# Patient Record
Sex: Male | Born: 1970 | Race: White | Hispanic: No | Marital: Single | Smoking: Never smoker
Health system: Southern US, Community
[De-identification: ages and names within clinical notes are randomized; demographics above are authoritative.]

## PROBLEM LIST (undated history)

## (undated) DIAGNOSIS — I2510 Atherosclerotic heart disease of native coronary artery without angina pectoris: Secondary | ICD-10-CM

## (undated) DIAGNOSIS — E785 Hyperlipidemia, unspecified: Secondary | ICD-10-CM

## (undated) DIAGNOSIS — T7840XA Allergy, unspecified, initial encounter: Secondary | ICD-10-CM

## (undated) HISTORY — PX: WISDOM TOOTH EXTRACTION: SHX21

## (undated) HISTORY — DX: Allergy, unspecified, initial encounter: T78.40XA

## (undated) HISTORY — DX: Atherosclerotic heart disease of native coronary artery without angina pectoris: I25.10

---

## 2016-01-30 DIAGNOSIS — I219 Acute myocardial infarction, unspecified: Secondary | ICD-10-CM

## 2016-01-30 HISTORY — DX: Acute myocardial infarction, unspecified: I21.9

## 2016-02-05 ENCOUNTER — Encounter (HOSPITAL_COMMUNITY)
Admission: EM | Disposition: A | Discharge status: Home / Self Care | Payer: Self-pay | Source: Home / Self Care | Attending: Interventional Cardiology

## 2016-02-05 ENCOUNTER — Encounter (HOSPITAL_COMMUNITY): Payer: Self-pay | Admitting: Emergency Medicine

## 2016-02-05 ENCOUNTER — Inpatient Hospital Stay (HOSPITAL_COMMUNITY)
Admission: EM | Admit: 2016-02-05 | Discharge: 2016-02-07 | DRG: 247 | Disposition: A | Discharge status: Home / Self Care | Payer: BC Managed Care – PPO | Attending: Interventional Cardiology | Admitting: Interventional Cardiology

## 2016-02-05 DIAGNOSIS — I2510 Atherosclerotic heart disease of native coronary artery without angina pectoris: Secondary | ICD-10-CM

## 2016-02-05 DIAGNOSIS — I2119 ST elevation (STEMI) myocardial infarction involving other coronary artery of inferior wall: Principal | ICD-10-CM | POA: Diagnosis present

## 2016-02-05 DIAGNOSIS — Z955 Presence of coronary angioplasty implant and graft: Secondary | ICD-10-CM

## 2016-02-05 DIAGNOSIS — Z8249 Family history of ischemic heart disease and other diseases of the circulatory system: Secondary | ICD-10-CM

## 2016-02-05 DIAGNOSIS — I2111 ST elevation (STEMI) myocardial infarction involving right coronary artery: Secondary | ICD-10-CM

## 2016-02-05 DIAGNOSIS — I252 Old myocardial infarction: Secondary | ICD-10-CM | POA: Diagnosis present

## 2016-02-05 DIAGNOSIS — E785 Hyperlipidemia, unspecified: Secondary | ICD-10-CM | POA: Diagnosis not present

## 2016-02-05 HISTORY — PX: CARDIAC CATHETERIZATION: SHX172

## 2016-02-05 HISTORY — DX: Hyperlipidemia, unspecified: E78.5

## 2016-02-05 LAB — POCT ACTIVATED CLOTTING TIME
ACTIVATED CLOTTING TIME: 569 s
ACTIVATED CLOTTING TIME: 276 s

## 2016-02-05 LAB — MAGNESIUM: MAGNESIUM: 1.9 mg/dL (ref 1.7–2.4)

## 2016-02-05 LAB — LIPID PANEL
Cholesterol: 171 mg/dL (ref 0–200)
HDL: 27 mg/dL — AB (ref >40)
LDL CALC: 123 mg/dL — AB (ref 0–99)
Total CHOL/HDL Ratio: 6.3 RATIO
Triglycerides: 105 mg/dL (ref <150)
VLDL: 21 mg/dL (ref 0–40)

## 2016-02-05 LAB — CBC
HCT: 47.4 % (ref 39.0–52.0)
HCT: 43 % (ref 39.0–52.0)
Hemoglobin: 14.2 g/dL (ref 13.0–17.0)
Hemoglobin: 14.3 g/dL (ref 13.0–17.0)
MCH: 26.7 pg (ref 26.0–34.0)
MCH: 29.1 pg (ref 26.0–34.0)
MCHC: 30 g/dL (ref 30.0–36.0)
MCHC: 33.3 g/dL (ref 30.0–36.0)
MCV: 89.3 fL (ref 78.0–100.0)
MCV: 87.4 fL (ref 78.0–100.0)
PLATELETS: 174 10*3/uL (ref 150–400)
Platelets: 156 10*3/uL (ref 150–400)
RBC: 5.31 MIL/uL (ref 4.22–5.81)
RBC: 4.92 MIL/uL (ref 4.22–5.81)
RDW: 12.5 % (ref 11.5–15.5)
RDW: 12.2 % (ref 11.5–15.5)
WBC: 7.3 10*3/uL (ref 4.0–10.5)
WBC: 9.5 10*3/uL (ref 4.0–10.5)

## 2016-02-05 LAB — COMPREHENSIVE METABOLIC PANEL
ALT: 37 U/L (ref 17–63)
AST: 29 U/L (ref 15–41)
Albumin: 4.5 g/dL (ref 3.5–5.0)
Alkaline Phosphatase: 63 U/L (ref 38–126)
Anion gap: 11 (ref 5–15)
BUN: 14 mg/dL (ref 6–20)
CO2: 23 mmol/L (ref 22–32)
Calcium: 9 mg/dL (ref 8.9–10.3)
Chloride: 100 mmol/L — ABNORMAL LOW (ref 101–111)
Creatinine, Ser: 0.92 mg/dL (ref 0.61–1.24)
GFR calc Af Amer: >60 mL/min (ref >60)
GFR calc non Af Amer: >60 mL/min (ref >60)
Glucose, Bld: 118 mg/dL — ABNORMAL HIGH (ref 65–99)
Potassium: 4.8 mmol/L (ref 3.5–5.1)
Sodium: 134 mmol/L — ABNORMAL LOW (ref 135–145)
Total Bilirubin: 1.2 mg/dL (ref 0.3–1.2)
Total Protein: 7.8 g/dL (ref 6.5–8.1)

## 2016-02-05 LAB — MRSA PCR SCREENING: MRSA BY PCR: NEGATIVE

## 2016-02-05 LAB — APTT: aPTT: 28 seconds (ref 24–37)

## 2016-02-05 LAB — PROTIME-INR
INR: 1 (ref 0.00–1.49)
Prothrombin Time: 13.4 seconds (ref 11.6–15.2)

## 2016-02-05 LAB — CREATININE, SERUM
Creatinine, Ser: 0.84 mg/dL (ref 0.61–1.24)
GFR calc Af Amer: >60 mL/min (ref >60)
GFR calc non Af Amer: >60 mL/min (ref >60)

## 2016-02-05 LAB — TSH: TSH: 1.253 u[IU]/mL (ref 0.350–4.500)

## 2016-02-05 LAB — TROPONIN I
TROPONIN I: 1.26 ng/mL — AB (ref <0.031)
Troponin I: <0.03 ng/mL (ref <0.031)
Troponin I: 8.72 ng/mL (ref <0.031)

## 2016-02-05 SURGERY — LEFT HEART CATH AND CORONARY ANGIOGRAPHY

## 2016-02-05 MED ORDER — SODIUM CHLORIDE 0.9 % IV SOLN
INTRAVENOUS | Status: AC
  Administered 2016-02-05: 22:00:00 via INTRAVENOUS
  Administered 2016-02-05: 100 mL/h via INTRAVENOUS

## 2016-02-05 MED ORDER — OXYCODONE-ACETAMINOPHEN 5-325 MG PO TABS: 1.0000 | ORAL_TABLET | ORAL | Status: DC | PRN

## 2016-02-05 MED ORDER — HEPARIN SODIUM (PORCINE) 1000 UNIT/ML IJ SOLN
INTRAMUSCULAR | Status: AC
  Filled 2016-02-05: qty 1

## 2016-02-05 MED ORDER — ONDANSETRON HCL 4 MG/2ML IJ SOLN: 4.0000 mg | Freq: Four times a day (QID) | INTRAMUSCULAR | Status: DC | PRN

## 2016-02-05 MED ORDER — METOPROLOL TARTRATE 1 MG/ML IV SOLN
5.0000 mg | Freq: Once | INTRAVENOUS | Status: AC
  Administered 2016-02-05: 5 mg via INTRAVENOUS
  Filled 2016-02-05: qty 5

## 2016-02-05 MED ORDER — TICAGRELOR 90 MG PO TABS
ORAL_TABLET | ORAL | Status: AC
  Filled 2016-02-05: qty 2

## 2016-02-05 MED ORDER — MIDAZOLAM HCL 2 MG/2ML IJ SOLN
INTRAMUSCULAR | Status: DC | PRN
  Administered 2016-02-05: 1 mg via INTRAVENOUS

## 2016-02-05 MED ORDER — SODIUM CHLORIDE 0.9 % IV SOLN: 250.0000 mL | INTRAVENOUS | Status: DC | PRN

## 2016-02-05 MED ORDER — HEPARIN (PORCINE) IN NACL 100-0.45 UNIT/ML-% IJ SOLN
INTRAMUSCULAR | Status: AC
  Administered 2016-02-05: 1000 [IU]/h via INTRAVENOUS
  Filled 2016-02-05: qty 250

## 2016-02-05 MED ORDER — HEPARIN (PORCINE) IN NACL 2-0.9 UNIT/ML-% IJ SOLN
INTRAMUSCULAR | Status: AC
  Filled 2016-02-05: qty 500

## 2016-02-05 MED ORDER — SODIUM CHLORIDE 0.9% FLUSH
3.0000 mL | Freq: Two times a day (BID) | INTRAVENOUS | Status: DC
  Administered 2016-02-05 – 2016-02-07 (×4): 3 mL via INTRAVENOUS

## 2016-02-05 MED ORDER — TICAGRELOR 90 MG PO TABS
ORAL_TABLET | ORAL | Status: DC | PRN
  Administered 2016-02-05: 180 mg via ORAL

## 2016-02-05 MED ORDER — ENOXAPARIN SODIUM 40 MG/0.4ML ~~LOC~~ SOLN: 40.0000 mg | SUBCUTANEOUS | Status: DC

## 2016-02-05 MED ORDER — ATROPINE SULFATE 0.1 MG/ML IJ SOLN
INTRAMUSCULAR | Status: AC
  Filled 2016-02-05: qty 10

## 2016-02-05 MED ORDER — NITROGLYCERIN 1 MG/10 ML FOR IR/CATH LAB
INTRA_ARTERIAL | Status: DC | PRN
  Administered 2016-02-05: 200 ug via INTRACORONARY
  Administered 2016-02-05: 100 ug via INTRACORONARY

## 2016-02-05 MED ORDER — ATROPINE SULFATE 0.1 MG/ML IJ SOLN
INTRAMUSCULAR | Status: DC | PRN
  Administered 2016-02-05: 1 mg via INTRAVENOUS

## 2016-02-05 MED ORDER — SODIUM CHLORIDE 0.9% FLUSH: 3.0000 mL | INTRAVENOUS | Status: DC | PRN

## 2016-02-05 MED ORDER — VERAPAMIL HCL 2.5 MG/ML IV SOLN
INTRAVENOUS | Status: AC
  Filled 2016-02-05: qty 2

## 2016-02-05 MED ORDER — TICAGRELOR 90 MG PO TABS
90.0000 mg | ORAL_TABLET | Freq: Two times a day (BID) | ORAL | Status: DC
  Administered 2016-02-05 – 2016-02-07 (×4): 90 mg via ORAL
  Filled 2016-02-05 (×4): qty 1

## 2016-02-05 MED ORDER — ASPIRIN 81 MG PO CHEW
81.0000 mg | CHEWABLE_TABLET | Freq: Every day | ORAL | Status: DC
  Administered 2016-02-06 – 2016-02-07 (×2): 81 mg via ORAL
  Filled 2016-02-05 (×2): qty 1

## 2016-02-05 MED ORDER — FENTANYL CITRATE (PF) 100 MCG/2ML IJ SOLN
INTRAMUSCULAR | Status: DC | PRN
  Administered 2016-02-05: 50 ug via INTRAVENOUS

## 2016-02-05 MED ORDER — SODIUM CHLORIDE 0.9% FLUSH
3.0000 mL | Freq: Two times a day (BID) | INTRAVENOUS | Status: DC
  Administered 2016-02-05 – 2016-02-06 (×3): 3 mL via INTRAVENOUS

## 2016-02-05 MED ORDER — LIDOCAINE HCL (PF) 1 % IJ SOLN
INTRAMUSCULAR | Status: DC | PRN
  Administered 2016-02-05: 3 mL

## 2016-02-05 MED ORDER — ALPRAZOLAM 0.25 MG PO TABS: 0.2500 mg | ORAL_TABLET | Freq: Two times a day (BID) | ORAL | Status: DC | PRN

## 2016-02-05 MED ORDER — METOPROLOL TARTRATE 25 MG PO TABS
25.0000 mg | ORAL_TABLET | Freq: Two times a day (BID) | ORAL | Status: DC
  Administered 2016-02-06 – 2016-02-07 (×3): 25 mg via ORAL
  Filled 2016-02-05 (×3): qty 1

## 2016-02-05 MED ORDER — HEPARIN (PORCINE) IN NACL 100-0.45 UNIT/ML-% IJ SOLN
1000.0000 [IU]/h | INTRAMUSCULAR | Status: DC
Start: 2016-02-05 — End: 2016-02-05
  Administered 2016-02-05: 1000 [IU]/h via INTRAVENOUS

## 2016-02-05 MED ORDER — LIDOCAINE HCL (PF) 1 % IJ SOLN
INTRAMUSCULAR | Status: AC
  Filled 2016-02-05: qty 30

## 2016-02-05 MED ORDER — NITROGLYCERIN 0.4 MG SL SUBL: 0.4000 mg | SUBLINGUAL_TABLET | SUBLINGUAL | Status: DC | PRN

## 2016-02-05 MED ORDER — ASPIRIN 81 MG PO CHEW
CHEWABLE_TABLET | ORAL | Status: AC
  Filled 2016-02-05: qty 4

## 2016-02-05 MED ORDER — ATORVASTATIN CALCIUM 80 MG PO TABS
80.0000 mg | ORAL_TABLET | Freq: Every day | ORAL | Status: DC
  Administered 2016-02-05 – 2016-02-06 (×2): 80 mg via ORAL
  Filled 2016-02-05 (×2): qty 1

## 2016-02-05 MED ORDER — MIDAZOLAM HCL 2 MG/2ML IJ SOLN
INTRAMUSCULAR | Status: AC
  Filled 2016-02-05: qty 2

## 2016-02-05 MED ORDER — SODIUM CHLORIDE 0.9 % IV SOLN
INTRAVENOUS | Status: DC | PRN
  Administered 2016-02-05: 285 mL/h via INTRAVENOUS
  Administered 2016-02-05: 999 mL/h via INTRAVENOUS

## 2016-02-05 MED ORDER — ZOLPIDEM TARTRATE 5 MG PO TABS: 5.0000 mg | ORAL_TABLET | Freq: Every evening | ORAL | Status: DC | PRN

## 2016-02-05 MED ORDER — ACETAMINOPHEN 325 MG PO TABS
650.0000 mg | ORAL_TABLET | ORAL | Status: DC | PRN
  Administered 2016-02-05 – 2016-02-06 (×2): 650 mg via ORAL
  Filled 2016-02-05 (×2): qty 2

## 2016-02-05 MED ORDER — ACETAMINOPHEN 325 MG PO TABS: 650.0000 mg | ORAL_TABLET | ORAL | Status: DC | PRN

## 2016-02-05 MED ORDER — SODIUM CHLORIDE 0.9 % IV SOLN
INTRAVENOUS | Status: DC
  Administered 2016-02-05: 11:00:00 via INTRAVENOUS

## 2016-02-05 MED ORDER — HEPARIN SODIUM (PORCINE) 5000 UNIT/ML IJ SOLN
4000.0000 [IU] | INTRAMUSCULAR | Status: AC
  Administered 2016-02-05: 4000 [IU] via INTRAVENOUS

## 2016-02-05 MED ORDER — FENTANYL CITRATE (PF) 100 MCG/2ML IJ SOLN
INTRAMUSCULAR | Status: AC
  Filled 2016-02-05: qty 2

## 2016-02-05 MED ORDER — ASPIRIN 81 MG PO CHEW
324.0000 mg | CHEWABLE_TABLET | Freq: Once | ORAL | Status: AC
  Administered 2016-02-05: 324 mg via ORAL

## 2016-02-05 MED ORDER — BIVALIRUDIN BOLUS VIA INFUSION - CUPID
INTRAVENOUS | Status: DC | PRN
  Administered 2016-02-05: 71.475 mg via INTRAVENOUS

## 2016-02-05 MED ORDER — HEPARIN (PORCINE) IN NACL 2-0.9 UNIT/ML-% IJ SOLN
INTRAMUSCULAR | Status: AC
  Filled 2016-02-05: qty 1000

## 2016-02-05 MED ORDER — IOHEXOL 350 MG/ML SOLN
INTRAVENOUS | Status: DC | PRN
  Administered 2016-02-05: 200 mL via INTRA_ARTERIAL

## 2016-02-05 MED ORDER — HEPARIN (PORCINE) IN NACL 2-0.9 UNIT/ML-% IJ SOLN
INTRAMUSCULAR | Status: DC | PRN
  Administered 2016-02-05: 1500 mL

## 2016-02-05 MED ORDER — ENOXAPARIN SODIUM 40 MG/0.4ML ~~LOC~~ SOLN
40.0000 mg | SUBCUTANEOUS | Status: DC
  Administered 2016-02-06: 40 mg via SUBCUTANEOUS
  Filled 2016-02-05: qty 0.4

## 2016-02-05 MED ORDER — BIVALIRUDIN 250 MG IV SOLR
INTRAVENOUS | Status: AC
  Filled 2016-02-05: qty 250

## 2016-02-05 MED ORDER — SODIUM CHLORIDE 0.9 % IV SOLN
250.0000 mg | INTRAVENOUS | Status: DC | PRN
  Administered 2016-02-05: 1.75 mg/kg/h via INTRAVENOUS

## 2016-02-05 SURGICAL SUPPLY — 17 items
BALLN EUPHORA RX 3.0X12 (BALLOONS) ×3
BALLN ~~LOC~~ EMERGE MR 4.5X12 (BALLOONS) ×3
BALLOON EUPHORA RX 3.0X12 (BALLOONS) ×1 IMPLANT
BALLOON ~~LOC~~ EMERGE MR 4.5X12 (BALLOONS) ×1 IMPLANT
CATH INFINITI 5 FR JL3.5 (CATHETERS) ×3 IMPLANT
CATH INFINITI 5FR JL4 (CATHETERS) ×3 IMPLANT
CATH VISTA GUIDE 6FR JR4 (CATHETERS) ×3 IMPLANT
DEVICE RAD COMP TR BAND LRG (VASCULAR PRODUCTS) ×3 IMPLANT
GLIDESHEATH SLEND A-KIT 6F 22G (SHEATH) ×3 IMPLANT
KIT ENCORE 26 ADVANTAGE (KITS) ×3 IMPLANT
KIT HEART LEFT (KITS) ×3 IMPLANT
PACK CARDIAC CATHETERIZATION (CUSTOM PROCEDURE TRAY) ×3 IMPLANT
STENT PROMUS PREM MR 4.0X20 (Permanent Stent) ×3 IMPLANT
TRANSDUCER W/STOPCOCK (MISCELLANEOUS) ×3 IMPLANT
TUBING CIL FLEX 10 FLL-RA (TUBING) ×3 IMPLANT
WIRE RUNTHROUGH .014X180CM (WIRE) ×3 IMPLANT
WIRE SAFE-T 1.5MM-J .035X260CM (WIRE) ×3 IMPLANT

## 2016-02-05 NOTE — ED Provider Notes (Signed)
CSN: [id number]     Arrival date & time 02/05/16  1054 History   Chief Complaint  Patient presents with  . Code STEMI   The history is provided by the patient. No language interpreter was used.     HPI Comments:  Paul Sullivan is a 45 y.o. male who presents to the Emergency Department complaining of CP. He was immediately evaluated and after being given his EKG. He reports chest pain which began around 9 AM this morning while moving. He describes a tightness/pressure in the center of his chest which progressively intensified. It did not radiate. Currently improved to the point of almost complete resolution. He denies any other associated symptoms such as dyspnea, nausea, diaphoresis or palpitations. Denies any past history of known coronary artery disease. He has been previously told that he has borderline high blood pressure and cholesterol, but is not on medications for it. He does not have a cardiologist.   History reviewed. No pertinent past medical history. History reviewed. No pertinent past surgical history. History reviewed. No pertinent family history. Social History  Substance Use Topics  . Smoking status: Never Smoker   . Smokeless tobacco: None  . Alcohol Use: No    Review of Systems  All other systems reviewed and are negative.   Allergies  Review of patient's allergies indicates no known allergies.  Home Medications   Prior to Admission medications   Not on File   BP 136/94 mmHg  Pulse 82  Temp(Src) 98.3 F (36.8 C) (Oral)  Resp 17  Ht 6\' 2"  (1.88 m)  Wt 210 lb (95.255 kg)  BMI 26.95 kg/m2  SpO2 97% Physical Exam  Constitutional: He appears well-developed and well-nourished. No distress.  HENT:  Head: Normocephalic and atraumatic.  Eyes: Conjunctivae are normal. Right eye exhibits no discharge. Left eye exhibits no discharge.  Neck: Neck supple.  Cardiovascular: Normal rate, regular rhythm and normal heart sounds.  Exam reveals no gallop and no  friction rub.   No murmur heard. Pulmonary/Chest: Effort normal and breath sounds normal. No respiratory distress.  Abdominal: Soft. He exhibits no distension. There is no tenderness.  Musculoskeletal: He exhibits no edema or tenderness.  Neurological: He is alert.  Skin: Skin is warm and dry.  Psychiatric: He has a normal mood and affect. His behavior is normal. Thought content normal.  Nursing note and vitals reviewed.   ED Course  Procedures   CRITICAL CARE Performed by: Virgel Manifold Total critical care time: 35 minutes Critical care time was exclusive of separately billable procedures and treating other patients. Critical care was necessary to treat or prevent imminent or life-threatening deterioration. Critical care was time spent personally by me on the following activities: development of treatment plan with patient and/or surrogate as well as nursing, discussions with consultants, evaluation of patient's response to treatment, examination of patient, obtaining history from patient or surrogate, ordering and performing treatments and interventions, ordering and review of laboratory studies, ordering and review of radiographic studies, pulse oximetry and re-evaluation of patient's condition.   DIAGNOSTIC STUDIES:  Oxygen Saturation is 96% on RA, normal by my interpretation.    COORDINATION OF CARE:  11:20 AM Discussed treatment plan with pt at bedside and pt agreed to plan.  Labs Review Labs Reviewed  CBC  APTT  COMPREHENSIVE METABOLIC PANEL  PROTIME-INR  TROPONIN I    Imaging Review No results found. I have personally reviewed and evaluated these images and lab results as part of my medical decision-making.  EKG Interpretation   Date/Time:  Tuesday February 05 2016 11:00:43 EST Ventricular Rate:  81 PR Interval:  150 QRS Duration: 93 QT Interval:  354 QTC Calculation: 411 R Axis:   85 Text Interpretation:  Sinus rhythm with sinus arrhythmia Inferior  infarct,  acute (RCA) No old tracing to compare Confirmed by [place]  MD, Laniece Hornbaker  ([id number]) on 02/05/2016 11:25:52 AM      MDM   Final diagnoses:  ST elevation myocardial infarction involving right coronary artery ([place])    45 year old male with chest pain. Inferior ST segment elevation with reciprocal changes. He was made a "code STEMI." Aspirin. Heparin. RV involvement considered. BP 130/90. HR 70s. Low dose metoprolol ordered. Discussed with Dr Tamala Julian, cardiology. Preferred beta blocker not be given but unfortunately it was before I could stop the nurse. Transfer to [facility].    Virgel Manifold, MD 02/05/16 1130

## 2016-02-05 NOTE — H&P (Signed)
History and Physical   Patient ID: Paul Sullivan MRN: [id number], DOB/AGE: [birth year] 45 y.o. Date of Encounter: 02/05/2016  Primary Physician: Chesley Noon, MD Primary Cardiologist: New, Dr Tamala Julian, may want f/u in [place]  Chief Complaint:  Inferior STEMI  HPI: Paul Sullivan is a 45 y.o. male with a family history of CAD, but no personal history of coronary artery disease, hypertension, hyperlipidemia or diabetes.  He was in his usual state of health recently. 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  Today, he was at his office about 8:00. He was having some chest discomfort, but did nothing about it. At approximately 9 AM, the symptoms got worse. He had been exerting himself prior to this, but was standing still when the pain got worse. The pain was a tightness and pressure. It radiated up into his left arm. He felt weak and may have been a little diaphoretic, but denies nausea, vomiting or shortness of breath. There is a 2 month history of exertional chest tightness prior to today. He did not understand the significance of the discomfort.  The pain reached to 10/10. He decided to get help for did drive to the [facility] emergency room. In the emergency room, his ECG was consistent with an inferior STEMI. He was treated with aspirin 324 mg, heparin 4000 units into drip plus metoprolol 5 mg IV. EMS was called and he was transported emergently to [facility] and taken directly to cath lab.  Upon arrival to the cath lab, his discomfort was improved, but was still present. His ECG was still abnormal.  History reviewed. No pertinent past medical history.  Surgical History: History reviewed. No pertinent past surgical history.   I have reviewed the patient's current medications. Prior to Admission medications   None   Scheduled Meds: . aspirin       Continuous Infusions: . sodium  chloride 20 mL/hr at 02/05/16 1117  . sodium chloride 999 mL/hr (02/05/16 1224)  . heparin 1,000 Units/hr (02/05/16 1114)   PRN Meds:.  Allergies: No Known Allergies  Social History   Social History  . Marital Status: Married    Spouse Name: N/A  . Number of Children: N/A  . Years of Education: N/A   Occupational History  . Blanket    Social History Main Topics  . Smoking status: Never Smoker   . Smokeless tobacco: Not on file  . Alcohol Use: No  . Drug Use: No  . Sexual Activity: Not on file   Other Topics Concern  . Not on file   Social History Narrative   Lives in [place] with family    Family History  Problem Relation Age of Onset  . Coronary artery disease Mother     Review of Systems:  Full Review of systems. Patient denies bleeding issues. No history of CVA. No recent surgery. Physically active. Moderate obesity. Known elevated blood sugar and lipids, being managed by nutritional modification and exercise. No history of bleeding. Denies abdominal discomfort, no gallbladder disease or other GI complaints. The 12 point review of systems is otherwise unremarkable.  Physical Exam: Blood pressure 138/90, pulse 88, temperature 98.3 F (36.8 C), temperature source Oral, resp. rate 18, height '6\' 2"'$  (1.88 m), weight 210 lb (95.255 kg), SpO2 98 %. General: Well developed, well nourished,male in no acute distress. Head: Normocephalic, atraumatic, sclera non-icteric, no xanthomas, nares  are without discharge. Dentition: Good Neck: No carotid bruits. JVD not elevated. No thyromegally Lungs: Good expansion bilaterally. without wheezes or rhonchi.  Heart: Regular rate and rhythm with S1 S2.  No S3 or S4.  No murmur, no rubs, or gallops appreciated. Abdomen: Soft, non-tender, non-distended with normoactive bowel sounds. No hepatomegaly. No rebound/guarding. No obvious abdominal masses. Msk:  Strength and tone appear normal for age. No joint  deformities or effusions, no spine or costo-vertebral angle tenderness. Extremities: No clubbing or cyanosis. No edema.  Distal pedal pulses are 2+ in 4 extrem Neuro: Alert and oriented X 3. Moves all extremities spontaneously. No focal deficits noted. Psych:  Responds to questions appropriately with a normal affect. Skin: No rashes or lesions noted  Labs:   Lab Results  Component Value Date   WBC 7.3 02/05/2016   HGB 14.2 02/05/2016   HCT 47.4 02/05/2016   MCV 89.3 02/05/2016   PLT 156 02/05/2016    Recent Labs  02/05/16 1109  INR 1.00     Recent Labs Lab 02/05/16 1109  NA 134*  K 4.8  CL 100*  CO2 23  BUN 14  CREATININE 0.92  CALCIUM 9.0  PROT 7.8  BILITOT 1.2  ALKPHOS 63  ALT 37  AST 29  GLUCOSE 118*    Recent Labs  02/05/16 1109  TROPONINI <0.03    Radiology/Studies: No results found.   ECG: 02/05/2016 Sinus rhythm, inferior ST elevation and PVCs noted  ASSESSMENT AND PLAN:  Principal Problem:   ST elevation myocardial infarction (STEMI) of inferior wall (HCC) - Emergent cardiac catheterization with further evaluation and treatment depending on the results. He will be screened for cardiac risk factors. He will be started on high-dose statin, a beta blocker and an ACE inhibitor as his blood pressure/renal function will allow.  Augusto Garbe 02/05/2016 12:27 PM Beeper [phone]  The patient has been seen in conjunction with Rosaria Ferries, PAC. The note has been corrected where needed in red type. The patient was met in the emergency room on the ambulance but he and brought her immediately to the cath lab. The patient was critically ill with ongoing acute inferior infarction having chest discomfort. All aspects of care were considered and discussed. The patient has been personally interviewed, examined, and all clinical data has been reviewed. Continuous care and management for greater than 30 minutes was given under emergency  circumstances until the coronary interventional procedure began.  The patient was counseled to undergo emergency left heart catheterization, coronary angiography, and possible percutaneous coronary intervention with stent implantation. The procedural risks and benefits were discussed in detail. The risks discussed included death, stroke, myocardial infarction, life-threatening bleeding, limb ischemia, kidney injury, allergy, and possible emergency cardiac surgery. The risk of these significant complications were estimated to occur less than 3% of the time. After discussion, the patient has agreed to proceed.  Acute inferior ST elevation myocardial infarction, hemodynamically stable but with ongoing chest discomfort with symptom duration less than 6 hours mandating mechanical reperfusion.

## 2016-02-05 NOTE — ED Notes (Addendum)
Pt reports midsternal cp with tingling down left arm  starting this morning around 0900 when moving office from an upstairs floor to downstairs.  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  Pt has no accompanying symptoms.

## 2016-02-05 NOTE — Care Management Note (Signed)
Case Management Note  Patient Details  Name: Kyvan West MRN: [id number] Date of Birth: Apr 26, 1971  Subjective/Objective:  Adm w mi                 Lives w fam, pcp dr Legrand Como badger   Expected Discharge Date:                  Expected Discharge Plan:  Home/Self Care  In-House Referral:     Discharge planning Services  CM Consult, Medication Assistance  Post Acute Care Choice:    Choice offered to:     DME Arranged:    DME Agency:     HH Arranged:    HH Agency:     Status of Service:     Medicare Important Message Given:    Date Medicare IM Given:    Medicare IM give by:    Date Additional Medicare IM Given:    Additional Medicare Important Message give by:     If discussed at [place] of Stay Meetings, dates discussed:    Additional Comments: ur review done. Gave pt 30day free and copay card for brilinta. bcbs ins.  Lacretia Leigh, RN 02/05/2016, 3:24 PM

## 2016-02-06 LAB — CBC
HCT: 43.4 % (ref 39.0–52.0)
HEMOGLOBIN: 14.4 g/dL (ref 13.0–17.0)
MCH: 29.2 pg (ref 26.0–34.0)
MCHC: 33.2 g/dL (ref 30.0–36.0)
MCV: 88 fL (ref 78.0–100.0)
PLATELETS: 161 10*3/uL (ref 150–400)
RBC: 4.93 MIL/uL (ref 4.22–5.81)
RDW: 12.6 % (ref 11.5–15.5)
WBC: 6.9 10*3/uL (ref 4.0–10.5)

## 2016-02-06 LAB — BASIC METABOLIC PANEL
ANION GAP: 9 (ref 5–15)
BUN: 8 mg/dL (ref 6–20)
CALCIUM: 8.7 mg/dL — AB (ref 8.9–10.3)
CHLORIDE: 108 mmol/L (ref 101–111)
CO2: 24 mmol/L (ref 22–32)
CREATININE: 0.83 mg/dL (ref 0.61–1.24)
GFR calc non Af Amer: >60 mL/min (ref >60)
Glucose, Bld: 102 mg/dL — ABNORMAL HIGH (ref 65–99)
Potassium: 3.9 mmol/L (ref 3.5–5.1)
SODIUM: 141 mmol/L (ref 135–145)

## 2016-02-06 LAB — LIPID PANEL
CHOL/HDL RATIO: 6.4 ratio
Cholesterol: 161 mg/dL (ref 0–200)
HDL: 25 mg/dL — ABNORMAL LOW (ref >40)
LDL Cholesterol: 112 mg/dL — ABNORMAL HIGH (ref 0–99)
Triglycerides: 122 mg/dL (ref <150)
VLDL: 24 mg/dL (ref 0–40)

## 2016-02-06 LAB — HEMOGLOBIN A1C
Hgb A1c MFr Bld: 5.8 % — ABNORMAL HIGH (ref 4.8–5.6)
Mean Plasma Glucose: 120 mg/dL

## 2016-02-06 LAB — TROPONIN I: TROPONIN I: 10.19 ng/mL — AB (ref <0.031)

## 2016-02-06 NOTE — Progress Notes (Signed)
       Patient Name: Paul Sullivan Date of Encounter: 02/06/2016    SUBJECTIVE:No recurrence of angina. Relatively quiet restful night. No medication side effects.   TELEMETRY:  NSR Filed Vitals:   02/06/16 0300 02/06/16 0400 02/06/16 0500 02/06/16 0600  BP: 98/75 101/73 98/81 97/69   Pulse: 60 67 70 61  Temp:  97.6 F (36.4 C)    TempSrc:  Oral    Resp: 17 17 16 16   Height:      Weight:  205 lb 12.8 oz (93.35 kg)    SpO2: 98% 95% 96% 97%    Intake/Output Summary (Last 24 hours) at 02/06/16 0706 Last data filed at 02/06/16 0600  Gross per 24 hour  Intake   1235 ml  Output   3575 ml  Net  -2340 ml   LABS: Basic Metabolic Panel:  Recent Labs  02/05/16 1109 02/05/16 1459 02/06/16 0222  NA 134*  --  141  K 4.8  --  3.9  CL 100*  --  108  CO2 23  --  24  GLUCOSE 118*  --  102*  BUN 14  --  8  CREATININE 0.92 0.84 0.83  CALCIUM 9.0  --  8.7*  MG  --  1.9  --    CBC:  Recent Labs  02/05/16 1459 02/06/16 0222  WBC 9.5 6.9  HGB 14.3 14.4  HCT 43.0 43.4  MCV 87.4 88.0  PLT 174 161   Cardiac Enzymes:  Recent Labs  02/05/16 1459 02/05/16 2042 02/06/16 0222  TROPONINI 1.26* 8.72* 10.19*   BNP: Invalid input(s): POCBNP Hemoglobin A1C:  Recent Labs  02/05/16 1459  HGBA1C 5.8*   Fasting Lipid Panel:  Recent Labs  02/06/16 0222  CHOL 161  HDL 25*  LDLCALC 112*  TRIG 122  CHOLHDL 6.4    Radiology/Studies:  No chest X ray.  Physical Exam: Blood pressure 97/69, pulse 61, temperature 97.6 F (36.4 C), temperature source Oral, resp. rate 16, height 6\' 2"  (1.88 m), weight 205 lb 12.8 oz (93.35 kg), SpO2 97 %. Weight change:   Wt Readings from Last 3 Encounters:  02/06/16 205 lb 12.8 oz (93.35 kg)    S4 gallop.  ASSESSMENT:  1. Inferior STEMI treated with PCI and DES. 2. Residual 60-80% LAD, intermediate stenosis 3. Hyperlipidemia  Plan:  1. Ambulate 2. Transfer to floor 3. Home tomorrow 4. OP stress nuclear in 2-3 weeks to  assess LAD 5. Back to see me soon as OP  Signed, Sinclair Grooms 02/06/2016, 7:06 AM

## 2016-02-06 NOTE — Progress Notes (Signed)
CARDIAC REHAB PHASE I   PRE:  Rate/Rhythm: 79 SR  BP:  Supine:   Sitting: 117/78  Standing:    SaO2:   MODE:  Ambulation: 600 ft   POST:  Rate/Rhythm: 94 SR  BP:  Supine:   Sitting: 118/93  Standing:    SaO2:  1355-1450 Pt walked 600 ft with steady gait. No CP. Tolerated well. MI education completed with pt who voiced understanding. Stressed importance of brilinta with stent. Has brilinta card. Reviewed NTG use, ex ed , heart healthy diet and risk factors. Referring to [place] Phase 2. Pt knows if he needs intervention on LAD that CRP 2 will start after that.   Graylon Good, RN BSN  02/06/2016 2:45 PM

## 2016-02-07 ENCOUNTER — Encounter (HOSPITAL_COMMUNITY): Payer: Self-pay | Admitting: Cardiology

## 2016-02-07 ENCOUNTER — Other Ambulatory Visit: Payer: Self-pay | Admitting: Physician Assistant

## 2016-02-07 ENCOUNTER — Telehealth: Payer: Self-pay | Admitting: Interventional Cardiology

## 2016-02-07 DIAGNOSIS — I2111 ST elevation (STEMI) myocardial infarction involving right coronary artery: Secondary | ICD-10-CM

## 2016-02-07 DIAGNOSIS — I2119 ST elevation (STEMI) myocardial infarction involving other coronary artery of inferior wall: Principal | ICD-10-CM

## 2016-02-07 DIAGNOSIS — E785 Hyperlipidemia, unspecified: Secondary | ICD-10-CM

## 2016-02-07 HISTORY — DX: Hyperlipidemia, unspecified: E78.5

## 2016-02-07 LAB — HIGH SENSITIVITY CRP: CRP HIGH SENSITIVITY: 3.64 mg/L — AB (ref 0.00–3.00)

## 2016-02-07 LAB — TROPONIN I: TROPONIN I: 2.37 ng/mL — AB (ref <0.031)

## 2016-02-07 MED ORDER — METOPROLOL TARTRATE 25 MG PO TABS: 25.0000 mg | ORAL_TABLET | Freq: Two times a day (BID) | ORAL | Status: DC

## 2016-02-07 MED ORDER — ASPIRIN 81 MG PO CHEW: 81.0000 mg | CHEWABLE_TABLET | Freq: Every day | ORAL | Status: DC

## 2016-02-07 MED ORDER — ATORVASTATIN CALCIUM 80 MG PO TABS: 80.0000 mg | ORAL_TABLET | Freq: Every day | ORAL | Status: DC

## 2016-02-07 MED ORDER — RAMIPRIL 2.5 MG PO CAPS: 2.5000 mg | ORAL_CAPSULE | Freq: Every day | ORAL | Status: DC

## 2016-02-07 MED ORDER — NITROGLYCERIN 0.4 MG SL SUBL: 0.4000 mg | SUBLINGUAL_TABLET | SUBLINGUAL | Status: DC | PRN

## 2016-02-07 MED ORDER — TICAGRELOR 90 MG PO TABS: 90.0000 mg | ORAL_TABLET | Freq: Two times a day (BID) | ORAL | Status: DC

## 2016-02-07 MED FILL — Verapamil HCl IV Soln 2.5 MG/ML: INTRAVENOUS | Qty: 2 | Status: AC

## 2016-02-07 NOTE — Telephone Encounter (Signed)
TCM per VIn 3/14 @ 930 am w/ Nicki Reaper

## 2016-02-07 NOTE — Progress Notes (Signed)
Patient seen and examined with Leanor Kail, PA. We discussed all aspects of the encounter. I agree with the assessment and plan as stated above.  Patient stable after inferior STEMI with PCI of the RCA.  He has residual borderline disease of the LAD and will get outp nuclear stress test in 2-3 weeks to assess for ischemia in the anterior wall.  Continue DAPT with Brilinta and ASA, BB, high dose statin and will add altace 2.5mg  daily.  Early followup in office.

## 2016-02-07 NOTE — Care Management Note (Signed)
Case Management Note  Patient Details  Name: Macalister Sirman MRN: [id number] Date of Birth: 20-Nov-1971  Subjective/Objective:    Pt admitted for [facility]. Pt is from home and plans to return home on Medication Brilinta. 30 day free card was provided by previous CM.                 Action/Plan: This CM did call [facility] in [place] and medication is available. No further needs from CM at this time.   Expected Discharge Date:                  Expected Discharge Plan:  Home/Self Care  In-House Referral:     Discharge planning Services  CM Consult, Medication Assistance  Post Acute Care Choice:  NA Choice offered to:  NA  DME Arranged:  N/A DME Agency:  NA  HH Arranged:  NA HH Agency:  NA  Status of Service:  Completed, signed off  Medicare Important Message Given:    Date Medicare IM Given:    Medicare IM give by:    Date Additional Medicare IM Given:    Additional Medicare Important Message give by:     If discussed at [place] of Stay Meetings, dates discussed:    Additional Comments:  Bethena Roys, RN 02/07/2016, 10:26 AM

## 2016-02-07 NOTE — Progress Notes (Signed)
CARDIAC REHAB PHASE I   PRE:  Rate/Rhythm: 76 SR  BP:  Supine:   Sitting: 107/71  Standing:    SaO2:   MODE:  Ambulation: 1100 ft   POST:  Rate/Rhythm: 88 SR  BP:  Supine:   Sitting: 109/86  Standing:    SaO2:  1132-1155 Pt walked 1100 ft with steady gait. No CP and tolerated well.   Graylon Good, RN BSN  02/07/2016 11:53 AM

## 2016-02-07 NOTE — Progress Notes (Signed)
Patient Name: Paul Sullivan Date of Encounter: 02/07/2016   SUBJECTIVE  Feeling well. No chest pain, sob or palpitations.   CURRENT MEDS . aspirin  81 mg Oral Daily  . atorvastatin  80 mg Oral q1800  . enoxaparin (LOVENOX) injection  40 mg Subcutaneous Q24H  . metoprolol tartrate  25 mg Oral BID  . sodium chloride flush  3 mL Intravenous Q12H  . sodium chloride flush  3 mL Intravenous Q12H  . ticagrelor  90 mg Oral BID    OBJECTIVE  Filed Vitals:   02/06/16 1557 02/06/16 1644 02/07/16 0404 02/07/16 0947  BP: 120/85 117/76 124/78 122/68  Pulse: 80 77 75 81  Temp: 97.9 F (36.6 C) 98 F (36.7 C) 98.1 F (36.7 C)   TempSrc: Oral Oral Oral   Resp: 12 15 16    Height:   6\' 2"  (1.88 m)   Weight:   201 lb (91.173 kg)   SpO2:  97% 98%     Intake/Output Summary (Last 24 hours) at 02/07/16 1031 Last data filed at 02/07/16 0945  Gross per 24 hour  Intake   1180 ml  Output      0 ml  Net   1180 ml   Filed Weights   02/05/16 1101 02/06/16 0400 02/07/16 0404  Weight: 210 lb (95.255 kg) 205 lb 12.8 oz (93.35 kg) 201 lb (91.173 kg)    PHYSICAL EXAM  General: Pleasant, NAD. Neuro: Alert and oriented X 3. Moves all extremities spontaneously. Psych: Normal affect. HEENT:  Normal  Neck: Supple without bruits or JVD. Lungs:  Resp regular and unlabored, CTA. Heart: RRR no s3, s4, or murmurs. Abdomen: Soft, non-tender, non-distended, BS + x 4.  Extremities: No clubbing, cyanosis or edema. DP/PT/Radials 2+ and equal bilaterally.  Accessory Clinical Findings  CBC  Recent Labs  02/05/16 1459 02/06/16 0222  WBC 9.5 6.9  HGB 14.3 14.4  HCT 43.0 43.4  MCV 87.4 88.0  PLT 174 [id number]   Basic Metabolic Panel  Recent Labs  02/05/16 1109 02/05/16 1459 02/06/16 0222  NA 134*  --  141  K 4.8  --  3.9  CL 100*  --  108  CO2 23  --  24  GLUCOSE 118*  --  102*  BUN 14  --  8  CREATININE 0.92 0.84 0.83  CALCIUM 9.0  --  8.7*  MG  --  1.9  --    Liver Function  Tests  Recent Labs  02/05/16 1109  AST 29  ALT 37  ALKPHOS 63  BILITOT 1.2  PROT 7.8  ALBUMIN 4.5   No results for input(s): LIPASE, AMYLASE in the last 72 hours. Cardiac Enzymes  Recent Labs  02/05/16 1459 02/05/16 2042 02/06/16 0222  TROPONINI 1.26* 8.72* 10.19*   BNP Invalid input(s): POCBNP D-Dimer No results for input(s): DDIMER in the last 72 hours. Hemoglobin A1C  Recent Labs  02/05/16 1459  HGBA1C 5.8*   Fasting Lipid Panel  Recent Labs  02/06/16 0222  CHOL 161  HDL 25*  LDLCALC 112*  TRIG 122  CHOLHDL 6.4   Thyroid Function Tests  Recent Labs  02/05/16 1459  TSH 1.253    TELE  NSR. Rate was in 140-170s in AM while brushing teeth. NSVT 4 beats x 1.   Radiology/Studies  No results found.  ASSESSMENT AND PLAN Principal Problem:   ST elevation myocardial infarction (STEMI) of inferior wall (HCC) Active Problems:   STEMI (ST elevation myocardial infarction) ([place])  Plan: S/p PCI & DES of mid RCA. He does have desidual 60-80% LAD, intermediate stenosis. Plan for staged PCI. Plan for outpatient stress test 2-3 weeks. TCM appointment and f/u with Dr. Tamala Julian soon.  Continue ASA, statin, Brilinta, lopressor. Add ACE/ARB?  Jarrett Soho PA-C Pager [phone]  Patient seen and examined with Leanor Kail, PA. We discussed all aspects of the encounter. I agree with the assessment and plan as stated above.  Patient stable post PCI of the RCA.  Plan for outpt nuclear stress test in 2-3 weeks to assess for ischemia in the LAD territory.  Continue with DAPT/BB/statin and add low dose ACE I.  Signed: Fransico Him, MD [facility] 02/07/2016

## 2016-02-07 NOTE — Discharge Summary (Signed)
Discharge Summary    Patient ID: Paul Sullivan,  MRN: [id number], DOB/AGE: 1971-06-15 45 y.o.  Admit date: 02/05/2016 Discharge date: 02/07/2016  Primary Care Provider: Chesley Noon Primary Cardiologist: Dr. Tamala Julian  Discharge Diagnoses    Principal Problem:   ST elevation myocardial infarction (STEMI) of inferior wall [facility]) Active Problems:   STEMI (ST elevation myocardial infarction) ([place])   Dyslipidemia, goal LDL below 70   Allergies No Known Allergies  Diagnostic Studies/Procedures    Left Heart Cath and Coronary Angiography    Conclusion    1. Ost Cx lesion, 30% stenosed. 2. Prox LAD to Mid LAD lesion, 80% stenosed. 3. Dist LAD-1 lesion, 50% stenosed. 4. Dist LAD-2 lesion, 60% stenosed. 5. Mid RCA to Dist RCA lesion, 50% stenosed. 6. There is mild left ventricular systolic dysfunction. 7. Mid RCA lesion, 100% stenosed. Post intervention, there is a 0% residual stenosis.   Acute inferior ST elevation myocardial infarction due to thrombotic occlusion of the mid RCA secondary to plaque rupture.  Successful angioplasty and DES implantation in the mid RCA reducing 100% stenosis to 0% with TIMI grade 3 flow. Final post dilataion diameter 4.5 mm.   Moderately severe proximal to mid LAD bifurcation lesion, [place] 1, 0, 1. Two diagonal side branches are present. Both are small. The mid and distal LAD contains a 60% sequential stenoses.  Widely patent circumflex.  Overall normal LV function with normal LVEDP  RECOMMENDATIONS:   Aspirin and Brilinta.  Consider staged LAD PCI prior to discharge or in several weeks as an elective case with me. If staged will need to get one of my colleagues to perform. We'll make a final decision in a.m.  Low-dose beta blocker therapy  Aggressive statin therapy  Phase II cardiac rehabilitation      History of Present Illness     Paul Sullivan is a 45 y.o. male with a family history of CAD, but no personal  history of coronary artery disease, hypertension, hyperlipidemia or diabetes.  He was in his usual state of health recently. 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  Ge was at his office about 8:00am 02/05/16. He was having some chest discomfort, but did nothing about it. At approximately 9 AM, the symptoms got worse. He had been exerting himself prior to this, but was standing still when the pain got worse. The pain was a tightness and pressure. It radiated up into his left arm. He felt weak and may have been a little diaphoretic, but denies nausea, vomiting or shortness of breath. There is a 2 month history of exertional chest tightness prior. He did not understand the significance of the discomfort.  The pain reached to 10/10. He decided to get help for did drive to the [facility] emergency room. In the emergency room, his ECG was consistent with an inferior STEMI. He was treated with aspirin 324 mg, heparin 4000 units into drip plus metoprolol 5 mg IV. EMS was called and he was transported emergently to [facility] and taken directly to cath lab.  Upon arrival to the cath lab, his discomfort was improved, but was still present. His ECG was still abnormal.  Hospital Course     Consultants: None  The patient underwent emergent cath which showed mid RCA 100% stenosed s/p PTCA and DES. He does have a disease in mid RCA to dist RCA and 60-80% LAD, intermediate stenosis.  Plan for staged PCI. Plan for outpatient stress test 2-3 weeks. Ambulated well without chest discomfort.  Mild LV dysfunction noted on cath. Consider outpatient echocardiogram. Continue ASA, statin, Brilinta, lopressor and ace.   The patient has been seen by Dr. Radford Pax today and deemed ready for discharge home. All follow-up appointments have been scheduled. Discharge medications are listed below.   TCM appointment and f/u with Dr. Tamala Julian soon.  Consider OP f/u labs 6-8 weeks given statin initiation this admission.   Discharge Vitals Blood pressure 122/68, pulse 81, temperature 98.1 F (36.7 C), temperature source Oral, resp. rate 16, height 6\' 2"  (1.88 m), weight 201 lb (91.173 kg), SpO2 98 %.  Filed Weights   02/05/16 1101 02/06/16 0400 02/07/16 0404  Weight: 210 lb (95.255 kg) 205 lb 12.8 oz (93.35 kg) 201 lb (91.173 kg)    Labs & Radiologic Studies     CBC  Recent Labs  02/05/16 1459 02/06/16 0222  WBC 9.5 6.9  HGB 14.3 14.4  HCT 43.0 43.4  MCV 87.4 88.0  PLT 174 [id number]   Basic Metabolic Panel  Recent Labs  02/05/16 1109 02/05/16 1459 02/06/16 0222  NA 134*  --  141  K 4.8  --  3.9  CL 100*  --  108  CO2 23  --  24  GLUCOSE 118*  --  102*  BUN 14  --  8  CREATININE 0.92 0.84 0.83  CALCIUM 9.0  --  8.7*  MG  --  1.9  --    Liver Function Tests  Recent Labs  02/05/16 1109  AST 29  ALT 37  ALKPHOS 63  BILITOT 1.2  PROT 7.8  ALBUMIN 4.5   No results for input(s): LIPASE, AMYLASE in the last 72 hours. Cardiac Enzymes  Recent Labs  02/05/16 2042 02/06/16 0222 02/07/16 1135  TROPONINI 8.72* 10.19* 2.37*   BNP Invalid input(s): POCBNP D-Dimer No results for input(s): DDIMER in the last 72 hours. Hemoglobin A1C  Recent Labs  02/05/16 1459  HGBA1C 5.8*   Fasting Lipid Panel  Recent Labs  02/06/16 0222  CHOL 161  HDL 25*  LDLCALC 112*  TRIG 122  CHOLHDL 6.4   Thyroid Function Tests  Recent Labs  02/05/16 1459  TSH 1.253    No results found.  Disposition   Pt is being discharged home today in good condition.  Follow-up Plans & Appointments    Follow-up Information    Follow up with Richardson Dopp, PA-C On 02/12/2016.   Specialties:  Physician Assistant, Radiology, Interventional Cardiology   Why:  @9 :30 for TCM   Contact information:   [address] [place] [address] [phone]       Follow up with Sinclair Grooms, MD On 03/07/2016.    Specialty:  Cardiology   Why:  @12 :00am for post hospital    Contact information:   [address] [address] [phone]       Follow up with [facility]. Go on 02/19/2016.   Specialty:  Cardiology   Why:  @10 :00 am for stress test   Contact information:   [address] [place] [phone]     Discharge Instructions    Amb Referral to Cardiac Rehabilitation    Complete by:  As directed   Diagnosis:   PCI Comment - has some blockage in LAD to ge GXT in few weeks Myocardial Infarction  Diet - low sodium heart healthy    Complete by:  As directed      Discharge instructions    Complete by:  As directed   No driving or return to work until seen clinic 02/12/16. You may return 02/13/16 once cleared during outpatient appointment. No lifting over 10 lbs for 4 weeks. No sexual activity for 4 weeks. Keep procedure site clean & dry. If you notice increased pain, swelling, bleeding or pus, call/return!  You may shower, but no soaking baths/hot tubs/pools for 1 week.     Increase activity slowly    Complete by:  As directed            Discharge Medications   Current Discharge Medication List    START taking these medications   Details  aspirin 81 MG chewable tablet Chew 1 tablet (81 mg total) by mouth daily. Qty: 30 tablet, Refills: 11    atorvastatin (LIPITOR) 80 MG tablet Take 1 tablet (80 mg total) by mouth daily at 6 PM. Qty: 30 tablet, Refills: 6    metoprolol tartrate (LOPRESSOR) 25 MG tablet Take 1 tablet (25 mg total) by mouth 2 (two) times daily. Qty: 60 tablet, Refills: 11    nitroGLYCERIN (NITROSTAT) 0.4 MG SL tablet Place 1 tablet (0.4 mg total) under the tongue every 5 (five) minutes x 3 doses as needed for chest pain. Qty: 25 tablet, Refills: 12    ramipril (ALTACE) 2.5 MG capsule Take 1 capsule (2.5 mg total) by mouth daily. Qty: 30 capsule, Refills: 6    ticagrelor  (BRILINTA) 90 MG TABS tablet Take 1 tablet (90 mg total) by mouth 2 (two) times daily. Qty: 180 tablet, Refills: 3      STOP taking these medications     ibuprofen (ADVIL,MOTRIN) 200 MG tablet          Aspirin prescribed at discharge?  Yes High Intensity Statin Prescribed? (Lipitor 40-80mg  or Crestor 20-40mg ): Yes Beta Blocker Prescribed? Yes For EF 45% or less, Was ACEI/ARB Prescribed? Yes ADP Receptor Inhibitor Prescribed? (i.e. Plavix etc.-Includes Medically Managed Patients): Yes For EF <40%, Aldosterone Inhibitor Prescribed? No:  Was EF assessed during THIS hospitalization? No. Mild LV dysfunction noted on cath. Consider outpatient echocardiogram. Was Cardiac Rehab II ordered? (Included Medically managed Patients): Yes   Outstanding Labs/Studies   Consider OP f/u labs 6-8 weeks given statin initiation this admission.  Duration of Discharge Encounter   Greater than 30 minutes including physician time.  Signed, Deyani Hegarty PA-C 02/07/2016, 1:22 PM

## 2016-02-08 NOTE — Telephone Encounter (Signed)
Patient contacted regarding discharge from [facility] on 02/07/16.  Patient understands to follow up with provider Richardson Dopp on 02/12/16 at 9:30 AM at [facility]. Patient understands discharge instructions? yes Patient understands medications and regiment? yes Patient understands to bring all medications to this visit? yes  States he is doing good.  No c/o CP or SOB.  Cath site was (R) wrist and does not have swelling or redness. States he did have some bleeding from IV site and had hard time getting it to stop.  Advised if occurs again needs to put pressure on the site.  Also advised that he will be more prone to bleeding and bruising since he is on Brilinta.  Reviewed his medications and is taking all medications as prescribed. Also advised that he needs to bring the medications to OV on 3/14.  He verbalizes understanding and will call if has any questions.

## 2016-02-11 DIAGNOSIS — I25119 Atherosclerotic heart disease of native coronary artery with unspecified angina pectoris: Secondary | ICD-10-CM | POA: Insufficient documentation

## 2016-02-11 NOTE — Progress Notes (Signed)
Cardiology Office Note:    Date:  02/12/2016   ID:  Paul Sullivan, DOB 1970-12-04, MRN [id number]  PCP:  Chesley Noon, MD  Cardiologist:  Dr. Daneen Schick   Electrophysiologist:  n/a  Chief Complaint  Patient presents with  . Hospitalization Follow-up    s/p Inf STEMI tx with PCI    History of Present Illness:     Paul Sullivan is a 45 y.o. male who was admitted 3/7-3/9 with inferior STEMI.  Emergent LHC demonstrated an occluded mid RCA which was treated with a DES. He had residual proximal LAD 80% stenosis and distal 50, 60% stenosis. Staged PCI of the LAD was considered. At discharge, plans for outpatient stress testing to assess for LAD territory ischemia.  Returns for follow-up. Here today with his father. Overall, he is doing well. Denies chest discomfort, shortness breath, syncope. Denies orthopnea, PND or edema. He is tolerating his medications well.   Past Medical History  Diagnosis Date  . Dyslipidemia, goal LDL below 70 02/07/2016  . CAD (coronary artery disease)     a. Inf STEMI 3/17 - LHC pLAD 80, dLAD 50/60, oLCx 30, mRCA 100, 50, Normal LVF >> PCI - DES to RCA;  b. OP nuc stress study to assess LAD territory ischemia    Past Surgical History  Procedure Laterality Date  . Cardiac catheterization N/A 02/05/2016    Procedure: Left Heart Cath and Coronary Angiography;  Surgeon: Belva Crome, MD;  Location: [facility];  Service: Cardiovascular;  Laterality: N/A;    Current Medications: Outpatient Prescriptions Prior to Visit  Medication Sig Dispense Refill  . aspirin 81 MG chewable tablet Chew 1 tablet (81 mg total) by mouth daily. 30 tablet 11  . atorvastatin (LIPITOR) 80 MG tablet Take 1 tablet (80 mg total) by mouth daily at 6 PM. 30 tablet 6  . metoprolol tartrate (LOPRESSOR) 25 MG tablet Take 1 tablet (25 mg total) by mouth 2 (two) times daily. 60 tablet 11  . nitroGLYCERIN (NITROSTAT) 0.4 MG SL tablet Place 1 tablet (0.4 mg total) under the  tongue every 5 (five) minutes x 3 doses as needed for chest pain. 25 tablet 12  . ramipril (ALTACE) 2.5 MG capsule Take 1 capsule (2.5 mg total) by mouth daily. 30 capsule 6  . ticagrelor (BRILINTA) 90 MG TABS tablet Take 1 tablet (90 mg total) by mouth 2 (two) times daily. 180 tablet 3   No facility-administered medications prior to visit.     Allergies:   Review of patient's allergies indicates no known allergies.   Social History   Social History  . Marital Status: Married    Spouse Name: N/A  . Number of Children: N/A  . Years of Education: N/A   Occupational History  . [employer]    Social History Main Topics  . Smoking status: Never Smoker   . Smokeless tobacco: None  . Alcohol Use: No  . Drug Use: No  . Sexual Activity: Not Asked   Other Topics Concern  . None   Social History Narrative   Lives in [place] with family     Family History:  The patient's family history includes Coronary artery disease in his mother; Heart failure in his maternal uncle; Hyperlipidemia in his father and mother; Hypertension in his father and mother.   ROS:   Please see the history of present illness.    ROS All other systems reviewed and are negative.   Physical Exam:  VS:  BP 108/74 mmHg  Pulse 70  Ht 6\' 2"  (1.88 m)  Wt 206 lb 9.6 oz (93.713 kg)  BMI 26.51 kg/m2   GEN: Well nourished, well developed, in no acute distress HEENT: normal Neck: no JVD, no masses Cardiac: Normal S1/S2, RRR; no murmurs, rubs, or gallops, no edema;  right wrist without hematoma or mass   Respiratory:  clear to auscultation bilaterally; no wheezing, rhonchi or rales GI: soft, nontender, nondistended MS: no deformity or atrophy Skin: warm and dry Neuro: No focal deficits  Psych: Alert and oriented x 3, normal affect  Wt Readings from Last 3 Encounters:  02/12/16 206 lb 9.6 oz (93.713 kg)  02/07/16 201 lb (91.173 kg)      Studies/Labs Reviewed:     EKG:  EKG  is  ordered today.  The ekg ordered today demonstrates NSR, HR 70, inf Q waves, QTc 375 ms, no sig changes   Recent Labs: 02/05/2016: ALT 37; Magnesium 1.9; TSH 1.253 02/06/2016: BUN 8; Creatinine, Ser 0.83; Hemoglobin 14.4; Platelets 161; Potassium 3.9; Sodium 141   Recent Lipid Panel    Component Value Date/Time   CHOL 161 02/06/2016 0222   TRIG 122 02/06/2016 0222   HDL 25* 02/06/2016 0222   CHOLHDL 6.4 02/06/2016 0222   VLDL 24 02/06/2016 0222   LDLCALC 112* 02/06/2016 0222    Additional studies/ records that were reviewed today include:   LHC 02/05/16 LAD proximal 80%, distal 50%, 60% RI small vessel LCx ostial 30% RCA mid 100%, mid 50% Normal LV function PCI: 4 x 20 mm Promus premier DES to the RCA Staged PCI of the LAD planned in the future  ASSESSMENT:     1. Coronary artery disease involving native coronary artery of native heart without angina pectoris   2. Dyslipidemia, goal LDL below 70     PLAN:     In order of problems listed above:  1. CAD - s/p Inf STEMI tx with DES to RCA.  He has residual LAD disease.  He has a nuclear stress test arranged for next week to assess for LAD territory ischemia. If +, he will need staged PCI of the LAD.  We discussed the importance of dual antiplatelet therapy.   -  Continue ASA, Brilinta, statin, beta-blocker, ACE inhibitor.  -  Start Cardiac Rehab after stress test  -  Nuc stress study next week  -  FU with Dr. Daneen Schick 03/2016  2. HL - Continue statin.  Check Lipids and LFTs in 6 weeks.     Medication Adjustments/Labs and Tests Ordered: Current medicines are reviewed at length with the patient today.  Concerns regarding medicines are outlined above.  Medication changes, Labs and Tests ordered today are outlined in the Patient Instructions noted below. Patient Instructions  Medication Instructions:  Your physician recommends that you continue on your current medications as directed. Please refer to the Current Medication  list given to you today.  Labwork: Your physician recommends that you return for a FASTING lipid profile and LFT 6 WEEKS  Testing/Procedures: None ordered  Follow-Up: Your physician recommends that you keep your scheduled follow-up appointment with Dr. Tamala Julian as planned.  Any Other Special Instructions Will Be Listed Below (If Applicable).  If you need a refill on your cardiac medications before your next appointment, please call your pharmacy.   Signed, Richardson Dopp, PA-C  02/12/2016 4:34 PM    [facility] [place], [place], [place]  [address] Phone: [phone]; Fax: [phone]

## 2016-02-12 ENCOUNTER — Ambulatory Visit (INDEPENDENT_AMBULATORY_CARE_PROVIDER_SITE_OTHER): Payer: BC Managed Care – PPO | Admitting: Physician Assistant

## 2016-02-12 ENCOUNTER — Encounter: Payer: Self-pay | Admitting: Physician Assistant

## 2016-02-12 VITALS — BP 108/74 | HR 70 | Ht 74.0 in | Wt 206.6 lb

## 2016-02-12 DIAGNOSIS — E785 Hyperlipidemia, unspecified: Secondary | ICD-10-CM | POA: Diagnosis not present

## 2016-02-12 DIAGNOSIS — I2510 Atherosclerotic heart disease of native coronary artery without angina pectoris: Secondary | ICD-10-CM

## 2016-02-12 NOTE — Patient Instructions (Addendum)
Medication Instructions:  Your physician recommends that you continue on your current medications as directed. Please refer to the Current Medication list given to you today.  Labwork: Your physician recommends that you return for a FASTING lipid profile and LFT 6 WEEKS  Testing/Procedures: None ordered  Follow-Up: Your physician recommends that you keep your scheduled follow-up appointment with Dr. Tamala Julian as planned.  Any Other Special Instructions Will Be Listed Below (If Applicable).  If you need a refill on your cardiac medications before your next appointment, please call your pharmacy.

## 2016-02-14 ENCOUNTER — Telehealth (HOSPITAL_COMMUNITY): Payer: Self-pay | Admitting: *Deleted

## 2016-02-14 NOTE — Telephone Encounter (Signed)
Patient given detailed instructions per Myocardial Perfusion Study Information Sheet for the test on 02/19/16. Patient notified to arrive 15 minutes early and that it is imperative to arrive on time for appointment to keep from having the test rescheduled.  If you need to cancel or reschedule your appointment, please call the office within 24 hours of your appointment. Failure to do so may result in a cancellation of your appointment, and a $50 no show fee. Patient verbalized understanding. Hubbard Robinson, RN

## 2016-02-19 ENCOUNTER — Ambulatory Visit (HOSPITAL_COMMUNITY): Payer: BC Managed Care – PPO | Attending: Cardiovascular Disease

## 2016-02-19 DIAGNOSIS — R079 Chest pain, unspecified: Secondary | ICD-10-CM | POA: Insufficient documentation

## 2016-02-19 DIAGNOSIS — I10 Essential (primary) hypertension: Secondary | ICD-10-CM | POA: Insufficient documentation

## 2016-02-19 DIAGNOSIS — I2111 ST elevation (STEMI) myocardial infarction involving right coronary artery: Secondary | ICD-10-CM | POA: Insufficient documentation

## 2016-02-19 DIAGNOSIS — R9439 Abnormal result of other cardiovascular function study: Secondary | ICD-10-CM | POA: Insufficient documentation

## 2016-02-19 LAB — MYOCARDIAL PERFUSION IMAGING
CHL CUP NUCLEAR SRS: 2
CHL CUP RESTING HR STRESS: 65 {beats}/min
LV dias vol: 109 mL (ref 62–150)
LV sys vol: 50 mL
NUC STRESS TID: 1.06
Peak HR: 104 {beats}/min
RATE: 0.37
SDS: 3
SSS: 5

## 2016-02-19 MED ORDER — REGADENOSON 0.4 MG/5ML IV SOLN
0.4000 mg | Freq: Once | INTRAVENOUS | Status: AC
  Administered 2016-02-19: 0.4 mg via INTRAVENOUS

## 2016-02-19 MED ORDER — TECHNETIUM TC 99M SESTAMIBI GENERIC - CARDIOLITE
10.7000 | Freq: Once | INTRAVENOUS | Status: AC | PRN
  Administered 2016-02-19: 11 via INTRAVENOUS

## 2016-02-19 MED ORDER — TECHNETIUM TC 99M SESTAMIBI GENERIC - CARDIOLITE
31.9000 | Freq: Once | INTRAVENOUS | Status: AC | PRN
  Administered 2016-02-19: 31.9 via INTRAVENOUS

## 2016-03-07 ENCOUNTER — Ambulatory Visit (INDEPENDENT_AMBULATORY_CARE_PROVIDER_SITE_OTHER): Payer: BC Managed Care – PPO | Admitting: Interventional Cardiology

## 2016-03-07 ENCOUNTER — Encounter: Payer: Self-pay | Admitting: Interventional Cardiology

## 2016-03-07 VITALS — BP 110/68 | HR 62 | Ht 74.0 in | Wt 204.8 lb

## 2016-03-07 DIAGNOSIS — I2510 Atherosclerotic heart disease of native coronary artery without angina pectoris: Secondary | ICD-10-CM | POA: Diagnosis not present

## 2016-03-07 DIAGNOSIS — E785 Hyperlipidemia, unspecified: Secondary | ICD-10-CM

## 2016-03-07 NOTE — Patient Instructions (Addendum)
Medication Instructions:  Your physician recommends that you continue on your current medications as directed. Please refer to the Current Medication list given to you today.   Labwork: Your physician recommends that you return for lab work around April 25,2017. Do not eat or drink after midnight-this is to check your lipid profile and liver profile.   Testing/Procedures: None   Follow-Up: Your physician wants you to follow-up in: 6 months with Dr Tamala Julian. (October 2017).You will receive a reminder letter in the mail two months in advance. If you don't receive a letter, please call our office to schedule the follow-up appointment.   Call our office if you have any chest discomfort that you cannot explain.        If you need a refill on your cardiac medications before your next appointment, please call your pharmacy.

## 2016-03-07 NOTE — Progress Notes (Signed)
Cardiology Office Note   Date:  03/07/2016   ID:  [name], DOB [birth year], MRN [id number]  PCP:  Chesley Noon, MD  Cardiologist:  Sinclair Grooms, MD   Chief Complaint  Patient presents with  . Coronary Artery Disease      History of Present Illness: Paul Sullivan is a 45 y.o. male who presents for ED, acute inferior infarct, right coronary DES stenting, residual 60-80% mid LAD with negative Cardiolite for evidence of ischemia done post discharge.  He is doing quite well. He is back to work. He's had no recurrent angina. He denies palpitations and syncope. There is no orthopnea or peripheral edema. Overall he feels well.  Past Medical History  Diagnosis Date  . Dyslipidemia, goal LDL below 70 02/07/2016  . CAD (coronary artery disease)     a. Inf STEMI 3/17 - LHC pLAD 80, dLAD 50/60, oLCx 30, mRCA 100, 50, Normal LVF >> PCI - DES to RCA;  b. OP nuc stress study to assess LAD territory ischemia    Past Surgical History  Procedure Laterality Date  . Cardiac catheterization N/A 02/05/2016    Procedure: Left Heart Cath and Coronary Angiography;  Surgeon: Belva Crome, MD;  Location: [facility];  Service: Cardiovascular;  Laterality: N/A;     Current Outpatient Prescriptions  Medication Sig Dispense Refill  . aspirin 81 MG chewable tablet Chew 1 tablet (81 mg total) by mouth daily. 30 tablet 11  . atorvastatin (LIPITOR) 80 MG tablet Take 1 tablet (80 mg total) by mouth daily at 6 PM. 30 tablet 6  . metoprolol tartrate (LOPRESSOR) 25 MG tablet Take 1 tablet (25 mg total) by mouth 2 (two) times daily. 60 tablet 11  . nitroGLYCERIN (NITROSTAT) 0.4 MG SL tablet Place 1 tablet (0.4 mg total) under the tongue every 5 (five) minutes x 3 doses as needed for chest pain. 25 tablet 12  . ramipril (ALTACE) 2.5 MG capsule Take 1 capsule (2.5 mg total) by mouth daily. 30 capsule 6  . ticagrelor (BRILINTA) 90 MG TABS tablet Take 1 tablet (90 mg total) by  mouth 2 (two) times daily. 180 tablet 3   No current facility-administered medications for this visit.    Allergies:   Review of patient's allergies indicates no known allergies.    Social History:  The patient  reports that he has never smoked. He has never used smokeless tobacco. He reports that he does not drink alcohol or use illicit drugs.   Family History:  The patient's family history includes Coronary artery disease in his mother; Heart failure in his maternal uncle; Hyperlipidemia in his father and mother; Hypertension in his father and mother.    ROS:  Please see the history of present illness.   Otherwise, review of systems are positive for none.   All other systems are reviewed and negative.    PHYSICAL EXAM: VS:  BP 110/68 mmHg  Pulse 62  Ht 6\' 2"  (1.88 m)  Wt 204 lb 12.8 oz (92.897 kg)  BMI 26.28 kg/m2 , BMI Body mass index is 26.28 kg/(m^2). GEN: Well nourished, well developed, in no acute distress HEENT: normal Neck: no JVD, carotid bruits, or masses Cardiac: RRR.  There is no murmur, rub, or gallop. There is no edema. Respiratory:  clear to auscultation bilaterally, normal work of breathing. GI: soft, nontender, nondistended, + BS MS: no deformity or atrophy Skin: warm and dry, no rash Neuro:  Strength and  sensation are intact Psych: euthymic mood, full affect   EKG:  EKG is not ordered today.    Recent Labs: 02/05/2016: ALT 37; Magnesium 1.9; TSH 1.253 02/06/2016: BUN 8; Creatinine, Ser 0.83; Hemoglobin 14.4; Platelets 161; Potassium 3.9; Sodium 141    Lipid Panel    Component Value Date/Time   CHOL 161 02/06/2016 0222   TRIG 122 02/06/2016 0222   HDL 25* 02/06/2016 0222   CHOLHDL 6.4 02/06/2016 0222   VLDL 24 02/06/2016 0222   LDLCALC 112* 02/06/2016 0222      Wt Readings from Last 3 Encounters:  03/07/16 204 lb 12.8 oz (92.897 kg)  02/19/16 201 lb (91.173 kg)  02/12/16 206 lb 9.6 oz (93.713 kg)      Other studies Reviewed: Additional  studies/ records that were reviewed today include: Findings of nuclear stress test. Findings at coronary angiography.. The findings include we discussed rationale for medical therapy of LAD.Marland Kitchen    ASSESSMENT AND PLAN:  1. Coronary artery disease involving native coronary artery of native heart without angina pectoris Stable without angina. LAD did not demonstrate ischemia on recent nuclear.  2. Dyslipidemia, goal LDL below 70 Do not yet have laboratory data on high intensity statin therapy. This is forthcoming.    Current medicines are reviewed at length with the patient today.  The patient has the following concerns regarding medicines: No change in current medical regimen.  The following changes/actions have been instituted:    Liver and lipid panel will be done later this month.  Increase physical activity.  Labs/ tests ordered today include:  No orders of the defined types were placed in this encounter.     Disposition:   FU with HS in 6 months  Signed, Sinclair Grooms, MD  03/07/2016 12:32 PM    [facility] [place], [place], [place]  [address] Phone: [phone]; Fax: [phone]

## 2016-03-10 ENCOUNTER — Ambulatory Visit (HOSPITAL_COMMUNITY)
Admission: RE | Admit: 2016-03-10 | Discharge: 2016-03-10 | Disposition: A | Discharge status: Home / Self Care | Payer: BC Managed Care – PPO | Source: Ambulatory Visit | Attending: Interventional Cardiology | Admitting: Interventional Cardiology

## 2016-03-10 VITALS — BP 98/68 | HR 73 | Ht 74.0 in | Wt 205.3 lb

## 2016-03-10 DIAGNOSIS — I2119 ST elevation (STEMI) myocardial infarction involving other coronary artery of inferior wall: Secondary | ICD-10-CM | POA: Insufficient documentation

## 2016-03-10 DIAGNOSIS — Z7982 Long term (current) use of aspirin: Secondary | ICD-10-CM | POA: Diagnosis not present

## 2016-03-10 DIAGNOSIS — E785 Hyperlipidemia, unspecified: Secondary | ICD-10-CM | POA: Diagnosis not present

## 2016-03-10 DIAGNOSIS — Z955 Presence of coronary angioplasty implant and graft: Secondary | ICD-10-CM | POA: Diagnosis not present

## 2016-03-10 DIAGNOSIS — I2510 Atherosclerotic heart disease of native coronary artery without angina pectoris: Secondary | ICD-10-CM | POA: Diagnosis not present

## 2016-03-10 DIAGNOSIS — Z79899 Other long term (current) drug therapy: Secondary | ICD-10-CM | POA: Diagnosis not present

## 2016-03-10 NOTE — Progress Notes (Signed)
Cardiac Individual Treatment Plan  Patient Details  Name: Paul Sullivan MRN: [id number] Date of Birth: [birth year] Referring Provider:  Belva Crome, MD  Initial Encounter Date:       CARDIAC REHAB PHASE II ORIENTATION from 03/10/2016 in [place]   Date  03/10/16      Visit Diagnosis: ST elevation (STEMI) myocardial infarction involving other coronary artery of inferior wall ([place])  Stented coronary artery  Patient's Home Medications on Admission:  Current outpatient prescriptions:  .  aspirin 81 MG chewable tablet, Chew 1 tablet (81 mg total) by mouth daily., Disp: 30 tablet, Rfl: 11 .  atorvastatin (LIPITOR) 80 MG tablet, Take 1 tablet (80 mg total) by mouth daily at 6 PM., Disp: 30 tablet, Rfl: 6 .  metoprolol tartrate (LOPRESSOR) 25 MG tablet, Take 1 tablet (25 mg total) by mouth 2 (two) times daily., Disp: 60 tablet, Rfl: 11 .  nitroGLYCERIN (NITROSTAT) 0.4 MG SL tablet, Place 1 tablet (0.4 mg total) under the tongue every 5 (five) minutes x 3 doses as needed for chest pain., Disp: 25 tablet, Rfl: 12 .  ramipril (ALTACE) 2.5 MG capsule, Take 1 capsule (2.5 mg total) by mouth daily., Disp: 30 capsule, Rfl: 6 .  ticagrelor (BRILINTA) 90 MG TABS tablet, Take 1 tablet (90 mg total) by mouth 2 (two) times daily., Disp: 180 tablet, Rfl: 3  Past Medical History: Past Medical History  Diagnosis Date  . Dyslipidemia, goal LDL below 70 02/07/2016  . CAD (coronary artery disease)     a. Inf STEMI 3/17 - LHC pLAD 80, dLAD 50/60, oLCx 30, mRCA 100, 50, Normal LVF >> PCI - DES to RCA;  b. OP nuc stress study to assess LAD territory ischemia    Tobacco Use: History  Smoking status  . Never Smoker   Smokeless tobacco  . Never Used    Labs:     Recent Review Flowsheet Data    Labs for ITP Cardiac and Pulmonary Rehab Latest Ref Rng 02/05/2016 02/06/2016   Cholestrol 0 - 200 mg/dL 171 161   LDLCALC 0 - 99 mg/dL 123(H) 112(H)   HDL >40 mg/dL 27(L) 25(L)    Trlycerides <150 mg/dL 105 122   Hemoglobin A1c 4.8 - 5.6 % 5.8(H) -      Capillary Blood Glucose: No results found for: GLUCAP   Exercise Target Goals: Date: 03/10/16  Exercise Program Goal: Individual exercise prescription set with THRR, safety & activity barriers. Participant demonstrates ability to understand and report RPE using BORG scale, to self-measure pulse accurately, and to acknowledge the importance of the exercise prescription.  Exercise Prescription Goal: Starting with aerobic activity 30 plus minutes a day, 3 days per week for initial exercise prescription. Provide home exercise prescription and guidelines that participant acknowledges understanding prior to discharge.  Activity Barriers & Risk Stratification:     Activity Barriers & Cardiac Risk Stratification - 03/10/16 1213    Activity Barriers & Cardiac Risk Stratification   Activity Barriers None   Cardiac Risk Stratification High      6 Minute Walk:     6 Minute Walk      03/11/16 1401       6 Minute Walk   Phase Initial     Distance 1600 feet     Walk Time 8 minutes     # of Rest Breaks 1     MPH 3.03     METS 3.32     RPE 12  Perceived Dyspnea  9     VO2 Peak 17.22     Symptoms No     Resting HR 73 bpm     Resting BP 98/68 mmHg     Max Ex. HR 97 bpm     Max Ex. BP 120/70 mmHg     2 Minute Post BP 90/64 mmHg        Initial Exercise Prescription:     Initial Exercise Prescription - 03/10/16 1044    Date of Initial Exercise Prescription   Date 03/10/16   Treadmill   MPH 2   Grade 0   Minutes 15   METs 2.53   Elliptical   Level 1   Minutes 15   METs 1.5   Prescription Details   Frequency (times per week) 3   Duration Progress to 30 minutes of continuous aerobic without signs/symptoms of physical distress   Intensity   THRR REST +  30   THRR 40-80% of Max Heartrate 114-135   Ratings of Perceived Exertion 11-13   Perceived Dyspnea 0-4   Progression   Progression  Continue to progress workloads to maintain intensity without signs/symptoms of physical distress.   Resistance Training   Training Prescription Yes   Weight 1   Reps 8-10      Perform Capillary Blood Glucose checks as needed.  Exercise Prescription Changes:   Exercise Comments:    Discharge Exercise Prescription (Final Exercise Prescription Changes):   Nutrition:  Target Goals: Understanding of nutrition guidelines, daily intake of sodium 1500mg , cholesterol 200mg , calories 30% from fat and 7% or less from saturated fats, daily to have 5 or more servings of fruits and vegetables.  Biometrics:     Pre Biometrics - 03/11/16 1732    Pre Biometrics   Waist Circumference 39 inches   Hip Circumference 39 inches   Waist to Hip Ratio 1 %   Triceps Skinfold 6 mm   % Body Fat 21.6 %   Grip Strength 83 kg   Flexibility 0 in  tried unable to reach toes   Single Leg Stand 61 seconds       Nutrition Therapy Plan and Nutrition Goals:     Nutrition Therapy & Goals - 03/10/16 1221    Personal Nutrition Goals   Personal Goal #1 Continue to eat a Paleo Diet/ Mediiterranean Diet   Intervention Plan   Expected Outcomes Short Term Goal: Understand basic principles of dietary content, such as calories, fat, sodium, cholesterol and nutrients.  He is satisfied with the Paleo/Metiterranean diet that he has set for himself. Plans on sticking with that at this time.       Nutrition Discharge: Rate Your Plate Scores:     Nutrition Assessments - 03/10/16 1226    MEDFICTS Scores   Pre Score 13      Nutrition Goals Re-Evaluation:   Psychosocial: Target Goals: Acknowledge presence or absence of depression, maximize coping skills, provide positive support system. Participant is able to verbalize types and ability to use techniques and skills needed for reducing stress and depression.  Initial Review & Psychosocial Screening:   Quality of Life Scores:     Quality of Life -  03/11/16 1735    Quality of Life Scores   Health/Function Pre 26.8 %   Socioeconomic Pre 29.25 %   Psych/Spiritual Pre 30 %   Family Pre 27.6 %   GLOBAL Pre 28.11 %      PHQ-9:     Recent Review Flowsheet Data  Depression screen [facility] 2/9 03/10/2016   Decreased Interest 0   Down, Depressed, Hopeless 0   PHQ - 2 Score 0   Altered sleeping 0   Tired, decreased energy 0   Change in appetite 1   Feeling bad or failure about yourself  0   Trouble concentrating 0   Moving slowly or fidgety/restless 0   Suicidal thoughts 0   PHQ-9 Score 1   Difficult doing work/chores Not difficult at all      Psychosocial Evaluation and Intervention:     Psychosocial Evaluation - 03/10/16 1232    Psychosocial Evaluation & Interventions   Interventions Encouraged to exercise with the program and follow exercise prescription   Continued Psychosocial Services Needed No      Psychosocial Re-Evaluation:   Vocational Rehabilitation: Provide vocational rehab assistance to qualifying candidates.   Vocational Rehab Evaluation & Intervention:     Vocational Rehab - 03/10/16 1220    Initial Vocational Rehab Evaluation & Intervention   Assessment shows need for Vocational Rehabilitation No      Education: Education Goals: Education classes will be provided on a weekly basis, covering required topics. Participant will state understanding/return demonstration of topics presented.  Learning Barriers/Preferences:     Learning Barriers/Preferences - 03/10/16 1220    Learning Barriers/Preferences   Learning Barriers None   Learning Preferences Skilled Demonstration  Doing      Education Topics: Hypertension, Hypertension Reduction -Define heart disease and high blood pressure. Discus how high blood pressure affects the body and ways to reduce high blood pressure.   Exercise and Your Heart -Discuss why it is important to exercise, the FITT principles of exercise, normal and abnormal  responses to exercise, and how to exercise safely.   Angina -Discuss definition of angina, causes of angina, treatment of angina, and how to decrease risk of having angina.   Cardiac Medications -Review what the following cardiac medications are used for, how they affect the body, and side effects that may occur when taking the medications.  Medications include Aspirin, Beta blockers, calcium channel blockers, ACE Inhibitors, angiotensin receptor blockers, diuretics, digoxin, and antihyperlipidemics.   Congestive Heart Failure -Discuss the definition of CHF, how to live with CHF, the signs and symptoms of CHF, and how keep track of weight and sodium intake.   Heart Disease and Intimacy -Discus the effect sexual activity has on the heart, how changes occur during intimacy as we age, and safety during sexual activity.   Smoking Cessation / COPD -Discuss different methods to quit smoking, the health benefits of quitting smoking, and the definition of COPD.   Nutrition I: Fats -Discuss the types of cholesterol, what cholesterol does to the heart, and how cholesterol levels can be controlled.   Nutrition II: Labels -Discuss the different components of food labels and how to read food label   Heart Parts and Heart Disease -Discuss the anatomy of the heart, the pathway of blood circulation through the heart, and these are affected by heart disease.   Stress I: Signs and Symptoms -Discuss the causes of stress, how stress may lead to anxiety and depression, and ways to limit stress.   Stress II: Relaxation -Discuss different types of relaxation techniques to limit stress.   Warning Signs of Stroke / TIA -Discuss definition of a stroke, what the signs and symptoms are of a stroke, and how to identify when someone is having stroke.   Knowledge Questionnaire Score:     Knowledge Questionnaire Score - 03/10/16 1220  Knowledge Questionnaire Score   Pre Score 20/24       Core Components/Risk Factors/Patient Goals at Admission:     Personal Goals and Risk Factors at Admission - 03/10/16 1228    Core Components/Risk Factors/Patient Goals on Admission   Personal Goal Other Yes   Personal Goal Increase activity level   Intervention Be able to get back to hiking at least 5 miles   Expected Outcomes Lose about 10lbs before graduation      Core Components/Risk Factors/Patient Goals Review:    Core Components/Risk Factors/Patient Goals at Discharge (Final Review):    ITP Comments:   Comments: Patient arrived for 1st visit/orientation/education at 9:30am. Patient was referred to CR by Dr. Tamala Julian due to STEMI (I21.19) and Stent placement (Z95.5). During orientation advised patient on arrival and appointment times what to wear, what to do before, during and after exercise. Reviewed attendance and class policy. Talked about inclement weather and class consultation policy. Pt is scheduled to return Cardiac Rehab on 03/19/16 at 3:45 pm. Pt was advised to come to class 5 minutes before class starts. He was also given instructions on meeting with the dietician and attending the Family Structure classes. Pt is eager to get started. Patient was able to complete 6 minute walk test. Patient was measured for the equipment. Discussed equipment safety with patient. Took patient pre-anthropometric measurements. Patient finished visit at 11:45 am.

## 2016-03-11 ENCOUNTER — Encounter (HOSPITAL_COMMUNITY): Payer: BC Managed Care – PPO

## 2016-03-19 ENCOUNTER — Encounter (HOSPITAL_COMMUNITY): Payer: BC Managed Care – PPO

## 2016-03-21 ENCOUNTER — Encounter (HOSPITAL_COMMUNITY)
Admission: RE | Admit: 2016-03-21 | Discharge: 2016-03-21 | Disposition: A | Discharge status: Home / Self Care | Payer: BC Managed Care – PPO | Source: Ambulatory Visit | Attending: Interventional Cardiology | Admitting: Interventional Cardiology

## 2016-03-21 DIAGNOSIS — I214 Non-ST elevation (NSTEMI) myocardial infarction: Secondary | ICD-10-CM | POA: Insufficient documentation

## 2016-03-21 DIAGNOSIS — Z955 Presence of coronary angioplasty implant and graft: Secondary | ICD-10-CM | POA: Insufficient documentation

## 2016-03-24 ENCOUNTER — Encounter (HOSPITAL_COMMUNITY)
Admission: RE | Admit: 2016-03-24 | Discharge: 2016-03-24 | Disposition: A | Discharge status: Home / Self Care | Payer: BC Managed Care – PPO | Source: Ambulatory Visit | Attending: Interventional Cardiology | Admitting: Interventional Cardiology

## 2016-03-24 DIAGNOSIS — I214 Non-ST elevation (NSTEMI) myocardial infarction: Secondary | ICD-10-CM | POA: Diagnosis not present

## 2016-03-25 ENCOUNTER — Other Ambulatory Visit: Payer: BC Managed Care – PPO

## 2016-03-26 ENCOUNTER — Encounter (HOSPITAL_COMMUNITY)
Admission: RE | Admit: 2016-03-26 | Discharge: 2016-03-26 | Disposition: A | Discharge status: Home / Self Care | Payer: BC Managed Care – PPO | Source: Ambulatory Visit | Attending: Interventional Cardiology | Admitting: Interventional Cardiology

## 2016-03-26 DIAGNOSIS — I214 Non-ST elevation (NSTEMI) myocardial infarction: Secondary | ICD-10-CM | POA: Diagnosis not present

## 2016-03-28 ENCOUNTER — Encounter (HOSPITAL_COMMUNITY)
Admission: RE | Admit: 2016-03-28 | Discharge: 2016-03-28 | Disposition: A | Discharge status: Home / Self Care | Payer: BC Managed Care – PPO | Source: Ambulatory Visit | Attending: Interventional Cardiology | Admitting: Interventional Cardiology

## 2016-03-28 DIAGNOSIS — I214 Non-ST elevation (NSTEMI) myocardial infarction: Secondary | ICD-10-CM | POA: Diagnosis not present

## 2016-03-29 NOTE — Progress Notes (Signed)
Cardiac Individual Treatment Plan  Patient Details  Name: Paul Sullivan MRN: [id number] Date of Birth: 1971/08/21 Referring Provider:    Initial Encounter Date:       CARDIAC REHAB PHASE II ORIENTATION from 03/10/2016 in [facility]   Date  03/10/16      Visit Diagnosis: No diagnosis found.  Patient's Home Medications on Admission:  Current outpatient prescriptions:  .  aspirin 81 MG chewable tablet, Chew 1 tablet (81 mg total) by mouth daily., Disp: 30 tablet, Rfl: 11 .  atorvastatin (LIPITOR) 80 MG tablet, Take 1 tablet (80 mg total) by mouth daily at 6 PM., Disp: 30 tablet, Rfl: 6 .  metoprolol tartrate (LOPRESSOR) 25 MG tablet, Take 1 tablet (25 mg total) by mouth 2 (two) times daily., Disp: 60 tablet, Rfl: 11 .  nitroGLYCERIN (NITROSTAT) 0.4 MG SL tablet, Place 1 tablet (0.4 mg total) under the tongue every 5 (five) minutes x 3 doses as needed for chest pain., Disp: 25 tablet, Rfl: 12 .  ramipril (ALTACE) 2.5 MG capsule, Take 1 capsule (2.5 mg total) by mouth daily., Disp: 30 capsule, Rfl: 6 .  ticagrelor (BRILINTA) 90 MG TABS tablet, Take 1 tablet (90 mg total) by mouth 2 (two) times daily., Disp: 180 tablet, Rfl: 3  Past Medical History: Past Medical History  Diagnosis Date  . Dyslipidemia, goal LDL below 70 02/07/2016  . CAD (coronary artery disease)     a. Inf STEMI 3/17 - LHC pLAD 80, dLAD 50/60, oLCx 30, mRCA 100, 50, Normal LVF >> PCI - DES to RCA;  b. OP nuc stress study to assess LAD territory ischemia    Tobacco Use: History  Smoking status  . Never Smoker   Smokeless tobacco  . Never Used    Labs:     Recent Review Flowsheet Data    Labs for ITP Cardiac and Pulmonary Rehab Latest Ref Rng 02/05/2016 02/06/2016   Cholestrol 0 - 200 mg/dL 171 161   LDLCALC 0 - 99 mg/dL 123(H) 112(H)   HDL >40 mg/dL 27(L) 25(L)   Trlycerides <150 mg/dL 105 122   Hemoglobin A1c 4.8 - 5.6 % 5.8(H) -      Capillary Blood Glucose: No results found  for: GLUCAP   Exercise Target Goals:    Exercise Program Goal: Individual exercise prescription set with THRR, safety & activity barriers. Participant demonstrates ability to understand and report RPE using BORG scale, to self-measure pulse accurately, and to acknowledge the importance of the exercise prescription.  Exercise Prescription Goal: Starting with aerobic activity 30 plus minutes a day, 3 days per week for initial exercise prescription. Provide home exercise prescription and guidelines that participant acknowledges understanding prior to discharge.  Activity Barriers & Risk Stratification:     Activity Barriers & Cardiac Risk Stratification - 03/10/16 1213    Activity Barriers & Cardiac Risk Stratification   Activity Barriers None   Cardiac Risk Stratification High      6 Minute Walk:     6 Minute Walk      03/11/16 1401       6 Minute Walk   Phase Initial     Distance 1600 feet     Walk Time 8 minutes     # of Rest Breaks 1     MPH 3.03     METS 3.32     RPE 12     Perceived Dyspnea  9     VO2 Peak 17.[place]  Symptoms No     Resting HR 73 bpm     Resting BP 98/68 mmHg     Max Ex. HR 97 bpm     Max Ex. BP 120/70 mmHg     2 Minute Post BP 90/64 mmHg        Initial Exercise Prescription:     Initial Exercise Prescription - 03/10/16 1044    Date of Initial Exercise RX and Referring Provider   Date 03/10/16   Treadmill   MPH 2   Grade 0   Minutes 15   METs 2.53   Elliptical   Level 1   Minutes 15   METs 1.5   Prescription Details   Frequency (times per week) 3   Duration Progress to 30 minutes of continuous aerobic without signs/symptoms of physical distress   Intensity   THRR REST +  30   THRR 40-80% of Max Heartrate 114-135   Ratings of Perceived Exertion 11-13   Perceived Dyspnea 0-4   Progression   Progression Continue to progress workloads to maintain intensity without signs/symptoms of physical distress.   Resistance Training    Training Prescription Yes   Weight 1   Reps 8-10      Perform Capillary Blood Glucose checks as needed.  Exercise Prescription Changes:      Exercise Prescription Changes      03/29/16 2000           Exercise Review   Progression Yes       Response to Exercise   Blood Pressure (Admit) 92/60 mmHg       Blood Pressure (Exercise) 110/82 mmHg       Blood Pressure (Exit) 90/58 mmHg       Heart Rate (Admit) 81 bpm       Heart Rate (Exercise) 114 bpm       Heart Rate (Exit) 87 bpm       Rating of Perceived Exertion (Exercise) 13       Symptoms None       Duration Progress to 30 minutes of continuous aerobic without signs/symptoms of physical distress       Intensity Rest + 30       Progression   Progression Continue to progress workloads to maintain intensity without signs/symptoms of physical distress.       Resistance Training   Training Prescription Yes       Weight 4       Reps 10-12       Interval Training   Interval Training No       Treadmill   MPH 2.5       Grade 1.5       Minutes 20       METs 3.41       NuStep   Level 2       Watts 88       Minutes 15       METs 3.61       Home Exercise Plan   Plans to continue exercise at Home       Frequency Add 2 additional days to program exercise sessions.          Exercise Comments:      Exercise Comments      03/29/16 2040           Exercise Comments Patient is progressing appropriately           Discharge Exercise Prescription (Final Exercise Prescription Changes):  Exercise Prescription Changes - 03/29/16 2000    Exercise Review   Progression Yes   Response to Exercise   Blood Pressure (Admit) 92/60 mmHg   Blood Pressure (Exercise) 110/82 mmHg   Blood Pressure (Exit) 90/58 mmHg   Heart Rate (Admit) 81 bpm   Heart Rate (Exercise) 114 bpm   Heart Rate (Exit) 87 bpm   Rating of Perceived Exertion (Exercise) 13   Symptoms None   Duration Progress to 30 minutes of continuous aerobic  without signs/symptoms of physical distress   Intensity Rest + 30   Progression   Progression Continue to progress workloads to maintain intensity without signs/symptoms of physical distress.   Resistance Training   Training Prescription Yes   Weight 4   Reps 10-12   Interval Training   Interval Training No   Treadmill   MPH 2.5   Grade 1.5   Minutes 20   METs 3.41   NuStep   Level 2   Watts 88   Minutes 15   METs 3.61   Home Exercise Plan   Plans to continue exercise at Home   Frequency Add 2 additional days to program exercise sessions.      Nutrition:  Target Goals: Understanding of nutrition guidelines, daily intake of sodium 1500mg , cholesterol 200mg , calories 30% from fat and 7% or less from saturated fats, daily to have 5 or more servings of fruits and vegetables.  Biometrics:     Pre Biometrics - 03/11/16 1732    Pre Biometrics   Waist Circumference 39 inches   Hip Circumference 39 inches   Waist to Hip Ratio 1 %   Triceps Skinfold 6 mm   % Body Fat 21.6 %   Grip Strength 83 kg   Flexibility 0 in  tried unable to reach toes   Single Leg Stand 61 seconds       Nutrition Therapy Plan and Nutrition Goals:     Nutrition Therapy & Goals - 03/10/16 1221    Personal Nutrition Goals   Personal Goal #1 Continue to eat a Paleo Diet/ Mediiterranean Diet   Intervention Plan   Expected Outcomes Short Term Goal: Understand basic principles of dietary content, such as calories, fat, sodium, cholesterol and nutrients.  He is satisfied with the Paleo/Metiterranean diet that he has set for himself. Plans on sticking with that at this time.       Nutrition Discharge: Rate Your Plate Scores:     Nutrition Assessments - 03/10/16 1226    MEDFICTS Scores   Pre Score 13      Nutrition Goals Re-Evaluation:   Psychosocial: Target Goals: Acknowledge presence or absence of depression, maximize coping skills, provide positive support system. Participant is able to  verbalize types and ability to use techniques and skills needed for reducing stress and depression.  Initial Review & Psychosocial Screening:     Initial Psych Review & Screening - 03/28/16 [place]? Yes   Barriers   Psychosocial barriers to participate in program There are no identifiable barriers or psychosocial needs.   Screening Interventions   Interventions Encouraged to exercise      Quality of Life Scores:     Quality of Life - 03/11/16 1735    Quality of Life Scores   Health/Function Pre 26.8 %   Socioeconomic Pre 29.25 %   Psych/Spiritual Pre 30 %   Family Pre 27.6 %   GLOBAL Pre 28.11 %  PHQ-9:     Recent Review Flowsheet Data    Depression screen [facility] 2/9 03/10/2016   Decreased Interest 0   Down, Depressed, Hopeless 0   PHQ - 2 Score 0   Altered sleeping 0   Tired, decreased energy 0   Change in appetite 1   Feeling bad or failure about yourself  0   Trouble concentrating 0   Moving slowly or fidgety/restless 0   Suicidal thoughts 0   PHQ-9 Score 1   Difficult doing work/chores Not difficult at all      Psychosocial Evaluation and Intervention:     Psychosocial Evaluation - 03/10/16 1232    Psychosocial Evaluation & Interventions   Interventions Encouraged to exercise with the program and follow exercise prescription   Continued Psychosocial Services Needed No      Psychosocial Re-Evaluation:     Psychosocial Re-Evaluation      03/28/16 1348           Psychosocial Re-Evaluation   Interventions Encouraged to attend Cardiac Rehabilitation for the exercise       Continued Psychosocial Services Needed No          Vocational Rehabilitation: Provide vocational rehab assistance to qualifying candidates.   Vocational Rehab Evaluation & Intervention:     Vocational Rehab - 03/10/16 1220    Initial Vocational Rehab Evaluation & Intervention   Assessment shows need for Vocational Rehabilitation No       Education: Education Goals: Education classes will be provided on a weekly basis, covering required topics. Participant will state understanding/return demonstration of topics presented.  Learning Barriers/Preferences:     Learning Barriers/Preferences - 03/10/16 1220    Learning Barriers/Preferences   Learning Barriers None   Learning Preferences Skilled Demonstration  Doing      Education Topics: Hypertension, Hypertension Reduction -Define heart disease and high blood pressure. Discus how high blood pressure affects the body and ways to reduce high blood pressure.   Exercise and Your Heart -Discuss why it is important to exercise, the FITT principles of exercise, normal and abnormal responses to exercise, and how to exercise safely.   Angina -Discuss definition of angina, causes of angina, treatment of angina, and how to decrease risk of having angina.   Cardiac Medications -Review what the following cardiac medications are used for, how they affect the body, and side effects that may occur when taking the medications.  Medications include Aspirin, Beta blockers, calcium channel blockers, ACE Inhibitors, angiotensin receptor blockers, diuretics, digoxin, and antihyperlipidemics.   Congestive Heart Failure -Discuss the definition of CHF, how to live with CHF, the signs and symptoms of CHF, and how keep track of weight and sodium intake.   Heart Disease and Intimacy -Discus the effect sexual activity has on the heart, how changes occur during intimacy as we age, and safety during sexual activity.   Smoking Cessation / COPD -Discuss different methods to quit smoking, the health benefits of quitting smoking, and the definition of COPD.   Nutrition I: Fats -Discuss the types of cholesterol, what cholesterol does to the heart, and how cholesterol levels can be controlled.   Nutrition II: Labels -Discuss the different components of food labels and how to read food  label   Heart Parts and Heart Disease -Discuss the anatomy of the heart, the pathway of blood circulation through the heart, and these are affected by heart disease.   Stress I: Signs and Symptoms -Discuss the causes of stress, how stress may lead to anxiety  and depression, and ways to limit stress.   Stress II: Relaxation -Discuss different types of relaxation techniques to limit stress.   Warning Signs of Stroke / TIA -Discuss definition of a stroke, what the signs and symptoms are of a stroke, and how to identify when someone is having stroke.          CARDIAC REHAB PHASE II EXERCISE from 03/26/2016 in [place]   Date  03/26/16   Educator  Russella Dar   Instruction Review Code  2- meets goals/outcomes      Knowledge Questionnaire Score:     Knowledge Questionnaire Score - 03/10/16 1220    Knowledge Questionnaire Score   Pre Score 20/24      Core Components/Risk Factors/Patient Goals at Admission:     Personal Goals and Risk Factors at Admission - 03/10/16 1228    Core Components/Risk Factors/Patient Goals on Admission   Personal Goal Other Yes   Personal Goal Increase activity level   Intervention Be able to get back to hiking at least 5 miles   Expected Outcomes Lose about 10lbs before graduation      Core Components/Risk Factors/Patient Goals Review:      Goals and Risk Factor Review      03/28/16 1345           Core Components/Risk Factors/Patient Goals Review   Personal Goals Review Weight Management/Obesity;Lipids;Other  back to hiking, increase activity       Review No progress due to only 3 visits into the program at this time. Will continue progress patient on machines and educate       Expected Outcomes lose weight, increase activity          Core Components/Risk Factors/Patient Goals at Discharge (Final Review):      Goals and Risk Factor Review - 03/28/16 1345    Core Components/Risk Factors/Patient Goals Review    Personal Goals Review Weight Management/Obesity;Lipids;Other  back to hiking, increase activity   Review No progress due to only 3 visits into the program at this time. Will continue progress patient on machines and educate   Expected Outcomes lose weight, increase activity      ITP Comments:   Comments: ITP REVIEW Pt is making expected progress toward personal goals after completing 5 sessions.   Recommend continued exercise, life style modification, education, and utilization of home exercise plan to increase stamina and strength on off days from Cardiac Rehab.

## 2016-03-31 ENCOUNTER — Encounter (HOSPITAL_COMMUNITY): Payer: BC Managed Care – PPO

## 2016-04-02 ENCOUNTER — Encounter (HOSPITAL_COMMUNITY)
Admission: RE | Admit: 2016-04-02 | Discharge: 2016-04-02 | Disposition: A | Discharge status: Home / Self Care | Payer: BC Managed Care – PPO | Source: Ambulatory Visit | Attending: Interventional Cardiology | Admitting: Interventional Cardiology

## 2016-04-02 DIAGNOSIS — I214 Non-ST elevation (NSTEMI) myocardial infarction: Secondary | ICD-10-CM | POA: Insufficient documentation

## 2016-04-02 DIAGNOSIS — Z955 Presence of coronary angioplasty implant and graft: Secondary | ICD-10-CM | POA: Insufficient documentation

## 2016-04-04 ENCOUNTER — Encounter (HOSPITAL_COMMUNITY)
Admission: RE | Admit: 2016-04-04 | Discharge: 2016-04-04 | Disposition: A | Discharge status: Home / Self Care | Payer: BC Managed Care – PPO | Source: Ambulatory Visit | Attending: Interventional Cardiology | Admitting: Interventional Cardiology

## 2016-04-04 DIAGNOSIS — I214 Non-ST elevation (NSTEMI) myocardial infarction: Secondary | ICD-10-CM | POA: Diagnosis not present

## 2016-04-07 ENCOUNTER — Encounter (HOSPITAL_COMMUNITY)
Admission: RE | Admit: 2016-04-07 | Discharge: 2016-04-07 | Disposition: A | Discharge status: Home / Self Care | Payer: BC Managed Care – PPO | Source: Ambulatory Visit | Attending: Interventional Cardiology | Admitting: Interventional Cardiology

## 2016-04-07 DIAGNOSIS — I214 Non-ST elevation (NSTEMI) myocardial infarction: Secondary | ICD-10-CM | POA: Diagnosis not present

## 2016-04-09 ENCOUNTER — Encounter (HOSPITAL_COMMUNITY)
Admission: RE | Admit: 2016-04-09 | Discharge: 2016-04-09 | Disposition: A | Discharge status: Home / Self Care | Payer: BC Managed Care – PPO | Source: Ambulatory Visit | Attending: Interventional Cardiology | Admitting: Interventional Cardiology

## 2016-04-09 DIAGNOSIS — I214 Non-ST elevation (NSTEMI) myocardial infarction: Secondary | ICD-10-CM | POA: Diagnosis not present

## 2016-04-11 ENCOUNTER — Encounter (HOSPITAL_COMMUNITY)
Admission: RE | Admit: 2016-04-11 | Discharge: 2016-04-11 | Disposition: A | Discharge status: Home / Self Care | Payer: BC Managed Care – PPO | Source: Ambulatory Visit | Attending: Interventional Cardiology | Admitting: Interventional Cardiology

## 2016-04-11 DIAGNOSIS — I214 Non-ST elevation (NSTEMI) myocardial infarction: Secondary | ICD-10-CM | POA: Diagnosis not present

## 2016-04-14 ENCOUNTER — Encounter (HOSPITAL_COMMUNITY): Payer: BC Managed Care – PPO

## 2016-04-16 ENCOUNTER — Encounter (HOSPITAL_COMMUNITY)
Admission: RE | Admit: 2016-04-16 | Discharge: 2016-04-16 | Disposition: A | Discharge status: Home / Self Care | Payer: BC Managed Care – PPO | Source: Ambulatory Visit | Attending: Interventional Cardiology | Admitting: Interventional Cardiology

## 2016-04-16 DIAGNOSIS — I214 Non-ST elevation (NSTEMI) myocardial infarction: Secondary | ICD-10-CM | POA: Diagnosis not present

## 2016-04-18 ENCOUNTER — Encounter (HOSPITAL_COMMUNITY)
Admission: RE | Admit: 2016-04-18 | Discharge: 2016-04-18 | Disposition: A | Discharge status: Home / Self Care | Payer: BC Managed Care – PPO | Source: Ambulatory Visit | Attending: Interventional Cardiology | Admitting: Interventional Cardiology

## 2016-04-18 DIAGNOSIS — I214 Non-ST elevation (NSTEMI) myocardial infarction: Secondary | ICD-10-CM | POA: Diagnosis not present

## 2016-04-21 ENCOUNTER — Encounter (HOSPITAL_COMMUNITY)
Admission: RE | Admit: 2016-04-21 | Discharge: 2016-04-21 | Disposition: A | Discharge status: Home / Self Care | Payer: BC Managed Care – PPO | Source: Ambulatory Visit | Attending: Interventional Cardiology | Admitting: Interventional Cardiology

## 2016-04-21 DIAGNOSIS — I214 Non-ST elevation (NSTEMI) myocardial infarction: Secondary | ICD-10-CM | POA: Diagnosis not present

## 2016-04-23 ENCOUNTER — Encounter (HOSPITAL_COMMUNITY)
Admission: RE | Admit: 2016-04-23 | Discharge: 2016-04-23 | Disposition: A | Discharge status: Home / Self Care | Payer: BC Managed Care – PPO | Source: Ambulatory Visit | Attending: Interventional Cardiology | Admitting: Interventional Cardiology

## 2016-04-23 DIAGNOSIS — I214 Non-ST elevation (NSTEMI) myocardial infarction: Secondary | ICD-10-CM | POA: Diagnosis not present

## 2016-04-23 NOTE — Progress Notes (Signed)
Daily Session Note  Patient Details  Name: Paul Sullivan MRN: [id number] Date of Birth: Jun 22, 1971 Referring Provider:    Encounter Date: 04/23/2016  Check In:     Session Check In - 04/23/16 1545    Check-In   Location [facility]   Staff Present Brodie Correll Angelina Pih, MS, EP, [facility], Exercise Physiologist;Christy Oletta Lamas, RN, BSN;Other  Nils Flack, EP   Supervising physician immediately available to respond to emergencies See telemetry face sheet for immediately available MD   Medication changes reported     No   Fall or balance concerns reported    No   Warm-up and Cool-down Performed as group-led instruction   Resistance Training Performed Yes   VAD Patient? No   Pain Assessment   Currently in Pain? No/denies   Multiple Pain Sites No      Capillary Blood Glucose: No results found for this or any previous visit (from the past 24 hour(s)).   Goals Met:  Independence with exercise equipment Improved SOB with ADL's Achieving weight loss Changing diet to healthy choices, watching portion sizes Exercise tolerated well No report of cardiac concerns or symptoms Strength training completed today  Goals Unmet:  RPE BP HR  Comments: Patient is progressing appropriately. Check out time: 4:45pm   Dr. Kate Sable is Medical Director for [place] and Pulmonary Rehab.

## 2016-04-25 ENCOUNTER — Encounter (HOSPITAL_COMMUNITY)
Admission: RE | Admit: 2016-04-25 | Discharge: 2016-04-25 | Disposition: A | Discharge status: Home / Self Care | Payer: BC Managed Care – PPO | Source: Ambulatory Visit | Attending: Interventional Cardiology | Admitting: Interventional Cardiology

## 2016-04-25 DIAGNOSIS — I2119 ST elevation (STEMI) myocardial infarction involving other coronary artery of inferior wall: Secondary | ICD-10-CM

## 2016-04-25 DIAGNOSIS — Z955 Presence of coronary angioplasty implant and graft: Secondary | ICD-10-CM

## 2016-04-25 DIAGNOSIS — I214 Non-ST elevation (NSTEMI) myocardial infarction: Secondary | ICD-10-CM | POA: Diagnosis not present

## 2016-04-25 NOTE — Progress Notes (Signed)
Daily Session Note  Patient Details  Name: Paul Sullivan MRN: [id number] Date of Birth: 11-Jun-1971 Referring Provider:    Encounter Date: 04/25/2016  Check In:     Session Check In - 04/25/16 1545    Check-In   Location [facility]   Staff Present Zaynab Chipman Angelina Pih, MS, EP, [facility], Exercise Physiologist;Christy Oletta Lamas, RN, BSN;Other  Nils Flack, EP   Supervising physician immediately available to respond to emergencies See telemetry face sheet for immediately available MD   Medication changes reported     No   Fall or balance concerns reported    No   Warm-up and Cool-down Performed as group-led instruction   Resistance Training Performed Yes   VAD Patient? No   Pain Assessment   Currently in Pain? No/denies   Pain Score 0-No pain   Multiple Pain Sites No      Capillary Blood Glucose: No results found for this or any previous visit (from the past 24 hour(s)).   Goals Met:  Independence with exercise equipment Achieving weight loss Exercise tolerated well No report of cardiac concerns or symptoms Strength training completed today  Goals Unmet:  RPE BP HR  Comments: Patient is progressing appropriately. Check out: 1645   Dr. Kate Sable is Medical Director for [place] and Pulmonary Rehab.

## 2016-04-28 ENCOUNTER — Encounter (HOSPITAL_COMMUNITY): Payer: BC Managed Care – PPO

## 2016-04-28 NOTE — Progress Notes (Signed)
Cardiac Individual Treatment Plan  Patient Details  Name: Paul Sullivan MRN: [id number] Date of Birth: June 19, 1971 Referring Provider:    Initial Encounter Date:       CARDIAC REHAB PHASE II ORIENTATION from 03/10/2016 in [place]   Date  03/10/16      Visit Diagnosis: ST elevation myocardial infarction (STEMI) of inferior wall ([place])  Stented coronary artery  Patient's Home Medications on Admission:  Current outpatient prescriptions:  .  aspirin 81 MG chewable tablet, Chew 1 tablet (81 mg total) by mouth daily., Disp: 30 tablet, Rfl: 11 .  atorvastatin (LIPITOR) 80 MG tablet, Take 1 tablet (80 mg total) by mouth daily at 6 PM., Disp: 30 tablet, Rfl: 6 .  metoprolol tartrate (LOPRESSOR) 25 MG tablet, Take 1 tablet (25 mg total) by mouth 2 (two) times daily., Disp: 60 tablet, Rfl: 11 .  nitroGLYCERIN (NITROSTAT) 0.4 MG SL tablet, Place 1 tablet (0.4 mg total) under the tongue every 5 (five) minutes x 3 doses as needed for chest pain., Disp: 25 tablet, Rfl: 12 .  ramipril (ALTACE) 2.5 MG capsule, Take 1 capsule (2.5 mg total) by mouth daily., Disp: 30 capsule, Rfl: 6 .  ticagrelor (BRILINTA) 90 MG TABS tablet, Take 1 tablet (90 mg total) by mouth 2 (two) times daily., Disp: 180 tablet, Rfl: 3  Past Medical History: Past Medical History  Diagnosis Date  . Dyslipidemia, goal LDL below 70 02/07/2016  . CAD (coronary artery disease)     a. Inf STEMI 3/17 - LHC pLAD 80, dLAD 50/60, oLCx 30, mRCA 100, 50, Normal LVF >> PCI - DES to RCA;  b. OP nuc stress study to assess LAD territory ischemia    Tobacco Use: History  Smoking status  . Never Smoker   Smokeless tobacco  . Never Used    Labs:     Recent Review Flowsheet Data    Labs for ITP Cardiac and Pulmonary Rehab Latest Ref Rng 02/05/2016 02/06/2016   Cholestrol 0 - 200 mg/dL 171 161   LDLCALC 0 - 99 mg/dL 123(H) 112(H)   HDL >40 mg/dL 27(L) 25(L)   Trlycerides <150 mg/dL 105 122   Hemoglobin A1c  4.8 - 5.6 % 5.8(H) -      Capillary Blood Glucose: No results found for: GLUCAP   Exercise Target Goals:    Exercise Program Goal: Individual exercise prescription set with THRR, safety & activity barriers. Participant demonstrates ability to understand and report RPE using BORG scale, to self-measure pulse accurately, and to acknowledge the importance of the exercise prescription.  Exercise Prescription Goal: Starting with aerobic activity 30 plus minutes a day, 3 days per week for initial exercise prescription. Provide home exercise prescription and guidelines that participant acknowledges understanding prior to discharge.  Activity Barriers & Risk Stratification:     Activity Barriers & Cardiac Risk Stratification - 03/10/16 1213    Activity Barriers & Cardiac Risk Stratification   Activity Barriers None   Cardiac Risk Stratification High      6 Minute Walk:     6 Minute Walk      03/11/16 1401       6 Minute Walk   Phase Initial     Distance 1600 feet     Walk Time 8 minutes     # of Rest Breaks 1     MPH 3.03     METS 3.32     RPE 12     Perceived Dyspnea  9  VO2 Peak 17.22     Symptoms No     Resting HR 73 bpm     Resting BP 98/68 mmHg     Max Ex. HR 97 bpm     Max Ex. BP 120/70 mmHg     2 Minute Post BP 90/64 mmHg        Initial Exercise Prescription:     Initial Exercise Prescription - 03/10/16 1044    Date of Initial Exercise RX and Referring Provider   Date 03/10/16   Treadmill   MPH 2   Grade 0   Minutes 15   METs 2.53   Elliptical   Level 1   Minutes 15   METs 1.5   Prescription Details   Frequency (times per week) 3   Duration Progress to 30 minutes of continuous aerobic without signs/symptoms of physical distress   Intensity   THRR REST +  30   THRR 40-80% of Max Heartrate 114-135   Ratings of Perceived Exertion 11-13   Perceived Dyspnea 0-4   Progression   Progression Continue to progress workloads to maintain intensity  without signs/symptoms of physical distress.   Resistance Training   Training Prescription Yes   Weight 1   Reps 8-10      Perform Capillary Blood Glucose checks as needed.  Exercise Prescription Changes:      Exercise Prescription Changes      03/29/16 2000 04/28/16 1200         Exercise Review   Progression Yes Yes      Response to Exercise   Blood Pressure (Admit) 92/60 mmHg 100/64 mmHg      Blood Pressure (Exercise) 110/82 mmHg 148/78 mmHg      Blood Pressure (Exit) 90/58 mmHg 98/62 mmHg      Heart Rate (Admit) 81 bpm 75 bpm      Heart Rate (Exercise) 114 bpm 138 bpm      Heart Rate (Exit) 87 bpm 90 bpm      Rating of Perceived Exertion (Exercise) 13 14      Symptoms None None      Duration Progress to 30 minutes of continuous aerobic without signs/symptoms of physical distress Progress to 30 minutes of continuous aerobic without signs/symptoms of physical distress      Intensity Rest + 30 Rest + 30      Progression   Progression Continue to progress workloads to maintain intensity without signs/symptoms of physical distress. Continue to progress workloads to maintain intensity without signs/symptoms of physical distress.      Resistance Training   Training Prescription Yes Yes      Weight 4 5      Reps 10-12 10-12      Interval Training   Interval Training No No      Treadmill   MPH 2.5 3      Grade 1.5 1      Minutes 20 20      METs 3.41 3.7      NuStep   Level 2 3      Watts 88 128      Minutes 15 15      METs 3.61 6.5      Elliptical   Level  0      Minutes  0      METs  0      Home Exercise Plan   Plans to continue exercise at [place]      Frequency Add 2 additional days  to program exercise sessions. Add 2 additional days to program exercise sessions.         Exercise Comments:      Exercise Comments      03/29/16 2040 04/28/16 1215         Exercise Comments Patient is progressing appropriately Patient is progressing appropriately.  Patient has lost 8.2lbs in 15 sessions by changing his diet and being conscious of his portion sizes.           Discharge Exercise Prescription (Final Exercise Prescription Changes):     Exercise Prescription Changes - 04/28/16 1200    Exercise Review   Progression Yes   Response to Exercise   Blood Pressure (Admit) 100/64 mmHg   Blood Pressure (Exercise) 148/78 mmHg   Blood Pressure (Exit) 98/62 mmHg   Heart Rate (Admit) 75 bpm   Heart Rate (Exercise) 138 bpm   Heart Rate (Exit) 90 bpm   Rating of Perceived Exertion (Exercise) 14   Symptoms None   Duration Progress to 30 minutes of continuous aerobic without signs/symptoms of physical distress   Intensity Rest + 30   Progression   Progression Continue to progress workloads to maintain intensity without signs/symptoms of physical distress.   Resistance Training   Training Prescription Yes   Weight 5   Reps 10-12   Interval Training   Interval Training No   Treadmill   MPH 3   Grade 1   Minutes 20   METs 3.7   NuStep   Level 3   Watts 128   Minutes 15   METs 6.5   Elliptical   Level 0   Minutes 0   METs 0   Home Exercise Plan   Plans to continue exercise at Home   Frequency Add 2 additional days to program exercise sessions.      Nutrition:  Target Goals: Understanding of nutrition guidelines, daily intake of sodium 1500mg , cholesterol 200mg , calories 30% from fat and 7% or less from saturated fats, daily to have 5 or more servings of fruits and vegetables.  Biometrics:     Pre Biometrics - 03/11/16 1732    Pre Biometrics   Waist Circumference 39 inches   Hip Circumference 39 inches   Waist to Hip Ratio 1 %   Triceps Skinfold 6 mm   % Body Fat 21.6 %   Grip Strength 83 kg   Flexibility 0 in  tried unable to reach toes   Single Leg Stand 61 seconds       Nutrition Therapy Plan and Nutrition Goals:     Nutrition Therapy & Goals - 03/10/16 1221    Personal Nutrition Goals   Personal Goal #1  Continue to eat a Paleo Diet/ Mediiterranean Diet   Intervention Plan   Expected Outcomes Short Term Goal: Understand basic principles of dietary content, such as calories, fat, sodium, cholesterol and nutrients.  He is satisfied with the Paleo/Metiterranean diet that he has set for himself. Plans on sticking with that at this time.       Nutrition Discharge: Rate Your Plate Scores:     Nutrition Assessments - 03/10/16 1226    MEDFICTS Scores   Pre Score 13      Nutrition Goals Re-Evaluation:   Psychosocial: Target Goals: Acknowledge presence or absence of depression, maximize coping skills, provide positive support system. Participant is able to verbalize types and ability to use techniques and skills needed for reducing stress and depression.  Initial Review & Psychosocial Screening:  Initial Psych Review & Screening - 03/28/16 [place]? Yes   Barriers   Psychosocial barriers to participate in program There are no identifiable barriers or psychosocial needs.   Screening Interventions   Interventions Encouraged to exercise      Quality of Life Scores:     Quality of Life - 03/11/16 1735    Quality of Life Scores   Health/Function Pre 26.8 %   Socioeconomic Pre 29.25 %   Psych/Spiritual Pre 30 %   Family Pre 27.6 %   GLOBAL Pre 28.11 %      PHQ-9:     Recent Review Flowsheet Data    Depression screen [facility] 2/9 03/10/2016   Decreased Interest 0   Down, Depressed, Hopeless 0   PHQ - 2 Score 0   Altered sleeping 0   Tired, decreased energy 0   Change in appetite 1   Feeling bad or failure about yourself  0   Trouble concentrating 0   Moving slowly or fidgety/restless 0   Suicidal thoughts 0   PHQ-9 Score 1   Difficult doing work/chores Not difficult at all      Psychosocial Evaluation and Intervention:     Psychosocial Evaluation - 03/10/16 1232    Psychosocial Evaluation & Interventions   Interventions  Encouraged to exercise with the program and follow exercise prescription   Continued Psychosocial Services Needed No      Psychosocial Re-Evaluation:     Psychosocial Re-Evaluation      03/28/16 1348 04/18/16 1635         Psychosocial Re-Evaluation   Interventions Encouraged to attend Cardiac Rehabilitation for the exercise Encouraged to attend Cardiac Rehabilitation for the exercise      Continued Psychosocial Services Needed No No         Vocational Rehabilitation: Provide vocational rehab assistance to qualifying candidates.   Vocational Rehab Evaluation & Intervention:     Vocational Rehab - 03/10/16 1220    Initial Vocational Rehab Evaluation & Intervention   Assessment shows need for Vocational Rehabilitation No      Education: Education Goals: Education classes will be provided on a weekly basis, covering required topics. Participant will state understanding/return demonstration of topics presented.  Learning Barriers/Preferences:     Learning Barriers/Preferences - 03/10/16 1220    Learning Barriers/Preferences   Learning Barriers None   Learning Preferences Skilled Demonstration  Doing      Education Topics: Hypertension, Hypertension Reduction -Define heart disease and high blood pressure. Discus how high blood pressure affects the body and ways to reduce high blood pressure.      CARDIAC REHAB PHASE II EXERCISE from 04/23/2016 in [place]   Date  04/02/16   Educator  Russella Dar   Instruction Review Code  2- meets goals/outcomes      Exercise and Your Heart -Discuss why it is important to exercise, the FITT principles of exercise, normal and abnormal responses to exercise, and how to exercise safely.      CARDIAC REHAB PHASE II EXERCISE from 04/23/2016 in [place]   Date  04/09/16   Educator  DC   Instruction Review Code  2- meets goals/outcomes      Angina -Discuss definition of angina, causes  of angina, treatment of angina, and how to decrease risk of having angina.   Cardiac Medications -Review what the following cardiac medications are used for, how they affect the body, and  side effects that may occur when taking the medications.  Medications include Aspirin, Beta blockers, calcium channel blockers, ACE Inhibitors, angiotensin receptor blockers, diuretics, digoxin, and antihyperlipidemics.      CARDIAC REHAB PHASE II EXERCISE from 04/23/2016 in [place]   Date  04/23/16   Educator  D. Allisen Pidgeon   Instruction Review Code  2- meets goals/outcomes      Congestive Heart Failure -Discuss the definition of CHF, how to live with CHF, the signs and symptoms of CHF, and how keep track of weight and sodium intake.   Heart Disease and Intimacy -Discus the effect sexual activity has on the heart, how changes occur during intimacy as we age, and safety during sexual activity.   Smoking Cessation / COPD -Discuss different methods to quit smoking, the health benefits of quitting smoking, and the definition of COPD.   Nutrition I: Fats -Discuss the types of cholesterol, what cholesterol does to the heart, and how cholesterol levels can be controlled.   Nutrition II: Labels -Discuss the different components of food labels and how to read food label   Heart Parts and Heart Disease -Discuss the anatomy of the heart, the pathway of blood circulation through the heart, and these are affected by heart disease.   Stress I: Signs and Symptoms -Discuss the causes of stress, how stress may lead to anxiety and depression, and ways to limit stress.   Stress II: Relaxation -Discuss different types of relaxation techniques to limit stress.   Warning Signs of Stroke / TIA -Discuss definition of a stroke, what the signs and symptoms are of a stroke, and how to identify when someone is having stroke.          CARDIAC REHAB PHASE II EXERCISE from 04/23/2016 in [place]   Date  03/26/16   Educator  Russella Dar   Instruction Review Code  2- meets goals/outcomes      Knowledge Questionnaire Score:     Knowledge Questionnaire Score - 03/10/16 1220    Knowledge Questionnaire Score   Pre Score 20/24      Core Components/Risk Factors/Patient Goals at Admission:     Personal Goals and Risk Factors at Admission - 03/10/16 1228    Core Components/Risk Factors/Patient Goals on Admission   Personal Goal Other Yes   Personal Goal Increase activity level   Intervention Be able to get back to hiking at least 5 miles   Expected Outcomes Lose about 10lbs before graduation      Core Components/Risk Factors/Patient Goals Review:      Goals and Risk Factor Review      03/28/16 1345 04/18/16 1634         Core Components/Risk Factors/Patient Goals Review   Personal Goals Review Weight Management/Obesity;Lipids;Other  back to hiking, increase activity Weight Management/Obesity;Lipids;Other      Review No progress due to only 3 visits into the program at this time. Will continue progress patient on machines and educate Will continue progress patient on machines and educate      Expected Outcomes lose weight, increase activity lose weight, increase activity         Core Components/Risk Factors/Patient Goals at Discharge (Final Review):      Goals and Risk Factor Review - 04/18/16 1634    Core Components/Risk Factors/Patient Goals Review   Personal Goals Review Weight Management/Obesity;Lipids;Other   Review Will continue progress patient on machines and educate   Expected Outcomes lose weight, increase activity  ITP Comments:   Comments: Patient is doing well with program. Patient has lost 8.2lbs in 15 sessions by making healthier nutritional choices and by being conscious of portion sizes. We will continue to monitor for progress.

## 2016-04-30 ENCOUNTER — Encounter (HOSPITAL_COMMUNITY)
Admission: RE | Admit: 2016-04-30 | Discharge: 2016-04-30 | Disposition: A | Discharge status: Home / Self Care | Payer: BC Managed Care – PPO | Source: Ambulatory Visit | Attending: Interventional Cardiology | Admitting: Interventional Cardiology

## 2016-04-30 DIAGNOSIS — I214 Non-ST elevation (NSTEMI) myocardial infarction: Secondary | ICD-10-CM | POA: Diagnosis not present

## 2016-04-30 NOTE — Progress Notes (Signed)
Daily Session Note  Patient Details  Name: Paul Sullivan MRN: [id number] Date of Birth: [birth year] Referring Provider:    Encounter Date: 04/30/2016  Check In:     Session Check In - 04/30/16 1556    Check-In   Location [facility]   Staff Present Diane Angelina Pih, MS, EP, [facility], Exercise Physiologist;Ifrah Vest Oletta Lamas, RN, BSN;Other   Supervising physician immediately available to respond to emergencies See telemetry face sheet for immediately available MD   Medication changes reported     No   Fall or balance concerns reported    No   Warm-up and Cool-down Performed as group-led instruction   Resistance Training Performed Yes   VAD Patient? No   Pain Assessment   Currently in Pain? No/denies      Capillary Blood Glucose: No results found for this or any previous visit (from the past 24 hour(s)).   Goals Met:  Proper associated with RPD/PD & O2 Sat Independence with exercise equipment Exercise tolerated well No report of cardiac concerns or symptoms Strength training completed today  Goals Unmet:  Not Applicable  Comments: check out 445   Dr. Kate Sable is Medical Director for [place] and Pulmonary Rehab.

## 2016-05-02 ENCOUNTER — Encounter (HOSPITAL_COMMUNITY)
Admission: RE | Admit: 2016-05-02 | Discharge: 2016-05-02 | Disposition: A | Discharge status: Home / Self Care | Payer: BC Managed Care – PPO | Source: Ambulatory Visit | Attending: Interventional Cardiology | Admitting: Interventional Cardiology

## 2016-05-02 DIAGNOSIS — I214 Non-ST elevation (NSTEMI) myocardial infarction: Secondary | ICD-10-CM | POA: Insufficient documentation

## 2016-05-02 DIAGNOSIS — Z955 Presence of coronary angioplasty implant and graft: Secondary | ICD-10-CM | POA: Diagnosis not present

## 2016-05-02 NOTE — Progress Notes (Signed)
Daily Session Note  Patient Details  Name: Paul Sullivan MRN: [id number] Date of Birth: 1971-04-06 Referring Provider:    Encounter Date: 05/02/2016  Check In:     Session Check In - 05/02/16 0815    Check-In   Location [facility]   Staff Present Russella Dar, MS, EP, [facility], Exercise Physiologist;Christy Oletta Lamas, RN, BSN;Other   Supervising physician immediately available to respond to emergencies See telemetry face sheet for immediately available MD   Medication changes reported     No   Fall or balance concerns reported    No   Warm-up and Cool-down Performed as group-led instruction   Resistance Training Performed Yes   VAD Patient? No   Pain Assessment   Pain Score 0-No pain   Multiple Pain Sites No      Capillary Blood Glucose: No results found for this or any previous visit (from the past 24 hour(s)).   Goals Met:  Independence with exercise equipment Exercise tolerated well No report of cardiac concerns or symptoms Strength training completed today  Goals Unmet:  Not Applicable  Comments: Check out: 9:15   Dr. Kate Sable is Medical Director for [place] and Pulmonary Rehab.

## 2016-05-05 ENCOUNTER — Encounter (HOSPITAL_COMMUNITY)
Admission: RE | Admit: 2016-05-05 | Discharge: 2016-05-05 | Disposition: A | Discharge status: Home / Self Care | Payer: BC Managed Care – PPO | Source: Ambulatory Visit | Attending: Interventional Cardiology | Admitting: Interventional Cardiology

## 2016-05-05 DIAGNOSIS — I214 Non-ST elevation (NSTEMI) myocardial infarction: Secondary | ICD-10-CM | POA: Diagnosis not present

## 2016-05-05 NOTE — Progress Notes (Signed)
Daily Session Note  Patient Details  Name: DEZI SCHANER MRN: [id number] Date of Birth: [birth year] Referring Provider:    Encounter Date: 05/05/2016  Check In:     Session Check In - 05/05/16 1545    Check-In   Location [facility]   Staff Present Bilal Manzer Angelina Pih, MS, EP, [facility], Exercise Physiologist;Christy Oletta Lamas, RN, BSN;Other  Nils Flack, EP   Supervising physician immediately available to respond to emergencies See telemetry face sheet for immediately available MD   Medication changes reported     No   Fall or balance concerns reported    No   Warm-up and Cool-down Performed as group-led instruction   Resistance Training Performed Yes   VAD Patient? No   Pain Assessment   Currently in Pain? No/denies   Pain Score 0-No pain   Multiple Pain Sites No      Capillary Blood Glucose: No results found for this or any previous visit (from the past 24 hour(s)).   Goals Met:  Independence with exercise equipment Exercise tolerated well No report of cardiac concerns or symptoms Strength training completed today  Goals Unmet:  Not Applicable  Comments: Check out: Daisy   Dr. Kate Sable is Medical Director for [place] and Pulmonary Rehab.

## 2016-05-07 ENCOUNTER — Encounter (HOSPITAL_COMMUNITY)
Admission: RE | Admit: 2016-05-07 | Discharge: 2016-05-07 | Disposition: A | Discharge status: Home / Self Care | Payer: BC Managed Care – PPO | Source: Ambulatory Visit | Attending: Interventional Cardiology | Admitting: Interventional Cardiology

## 2016-05-07 DIAGNOSIS — I214 Non-ST elevation (NSTEMI) myocardial infarction: Secondary | ICD-10-CM | POA: Diagnosis not present

## 2016-05-07 NOTE — Progress Notes (Signed)
Daily Session Note  Patient Details  Name: Paul Sullivan MRN: [id number] Date of Birth: Jun 12, 1971 Referring Provider:    Encounter Date: 05/07/2016  Check In:     Session Check In - 05/07/16 0815    Check-In   Location [facility]   Staff Present Russella Dar, MS, EP, [facility], Exercise Physiologist;Christy Oletta Lamas, RN, BSN;Other  Alpha physician immediately available to respond to emergencies See telemetry face sheet for immediately available MD   Medication changes reported     No   Fall or balance concerns reported    No   Warm-up and Cool-down Performed as group-led instruction   Resistance Training Performed Yes   VAD Patient? No   Pain Assessment   Currently in Pain? No/denies   Pain Score 0-No pain   Multiple Pain Sites No      Capillary Blood Glucose: No results found for this or any previous visit (from the past 24 hour(s)).   Goals Met:  Independence with exercise equipment Exercise tolerated well No report of cardiac concerns or symptoms Strength training completed today  Goals Unmet:  Not Applicable  Comments: Check Out 0915   Dr. Kate Sable is Medical Director for [place] and Pulmonary Rehab.

## 2016-05-09 ENCOUNTER — Encounter (HOSPITAL_COMMUNITY)
Admission: RE | Admit: 2016-05-09 | Discharge: 2016-05-09 | Disposition: A | Discharge status: Home / Self Care | Payer: BC Managed Care – PPO | Source: Ambulatory Visit | Attending: Interventional Cardiology | Admitting: Interventional Cardiology

## 2016-05-09 DIAGNOSIS — I214 Non-ST elevation (NSTEMI) myocardial infarction: Secondary | ICD-10-CM | POA: Diagnosis not present

## 2016-05-09 NOTE — Progress Notes (Signed)
Daily Session Note  Patient Details  Name: JOHNJOSEPH ROLFE MRN: [id number] Date of Birth: 1971-08-14 Referring Provider:    Encounter Date: 05/09/2016  Check In:     Session Check In - 05/09/16 1545    Check-In   Location [facility]   Staff Present Diane Angelina Pih, MS, EP, [facility], Exercise Physiologist;Yoshika Vensel Luther Parody, BS, EP, Exercise Physiologist;Christy Oletta Lamas, RN, BSN   Supervising physician immediately available to respond to emergencies See telemetry face sheet for immediately available MD   Medication changes reported     No   Fall or balance concerns reported    No   Warm-up and Cool-down Performed as group-led instruction   Resistance Training Performed Yes   VAD Patient? No   Pain Assessment   Currently in Pain? No/denies   Pain Score 0-No pain   Multiple Pain Sites No      Capillary Blood Glucose: No results found for this or any previous visit (from the past 24 hour(s)).   Goals Met:  Independence with exercise equipment Exercise tolerated well No report of cardiac concerns or symptoms Strength training completed today  Goals Unmet:  Not Applicable  Comments: Check out 445   Dr. Kate Sable is Medical Director for [place] and Pulmonary Rehab.

## 2016-05-12 ENCOUNTER — Encounter (HOSPITAL_COMMUNITY)
Admission: RE | Admit: 2016-05-12 | Discharge: 2016-05-12 | Disposition: A | Discharge status: Home / Self Care | Payer: BC Managed Care – PPO | Source: Ambulatory Visit | Attending: Interventional Cardiology | Admitting: Interventional Cardiology

## 2016-05-12 DIAGNOSIS — I214 Non-ST elevation (NSTEMI) myocardial infarction: Secondary | ICD-10-CM | POA: Diagnosis not present

## 2016-05-13 NOTE — Progress Notes (Signed)
Daily Session Note  Patient Details  Name: Paul Sullivan MRN: [id number] Date of Birth: January 03, 1971 Referring Provider:    Encounter Date: 05/12/2016  Check In: 3:45   Capillary Blood Glucose: No results found for this or any previous visit (from the past 24 hour(s)).   Goals Met:  Independence with exercise equipment Exercise tolerated well No report of cardiac concerns or symptoms Strength training completed today  Goals Unmet:  Not Applicable  Comments: Check out: 4:45pm   Dr. Kate Sable is Medical Director for [place] and Pulmonary Rehab.

## 2016-05-14 ENCOUNTER — Encounter (HOSPITAL_COMMUNITY)
Admission: RE | Admit: 2016-05-14 | Discharge: 2016-05-14 | Disposition: A | Discharge status: Home / Self Care | Payer: BC Managed Care – PPO | Source: Ambulatory Visit | Attending: Interventional Cardiology | Admitting: Interventional Cardiology

## 2016-05-14 DIAGNOSIS — I214 Non-ST elevation (NSTEMI) myocardial infarction: Secondary | ICD-10-CM | POA: Diagnosis not present

## 2016-05-14 NOTE — Progress Notes (Signed)
Daily Session Note  Patient Details  Name: Paul Sullivan MRN: [id number] Date of Birth: [birth year] Referring Provider:    Encounter Date: 05/14/2016  Check In:     Session Check In - 05/14/16 1545    Check-In   Location [facility]   Staff Present Diane Angelina Pih, MS, EP, [facility], Exercise Physiologist;Lexianna Weinrich Luther Parody, BS, EP, Exercise Physiologist;Christy Oletta Lamas, RN, BSN   Supervising physician immediately available to respond to emergencies See telemetry face sheet for immediately available MD   Medication changes reported     No   Fall or balance concerns reported    No   Warm-up and Cool-down Performed as group-led instruction   Resistance Training Performed Yes   VAD Patient? No   Pain Assessment   Currently in Pain? No/denies   Pain Score 0-No pain   Multiple Pain Sites No      Capillary Blood Glucose: No results found for this or any previous visit (from the past 24 hour(s)).   Goals Met:  Independence with exercise equipment Exercise tolerated well No report of cardiac concerns or symptoms Strength training completed today  Goals Unmet:  Not Applicable  Comments: 888   Dr. Kate Sable is Medical Director for [place] and Pulmonary Rehab.

## 2016-05-16 ENCOUNTER — Encounter (HOSPITAL_COMMUNITY)
Admission: RE | Admit: 2016-05-16 | Discharge: 2016-05-16 | Disposition: A | Discharge status: Home / Self Care | Payer: BC Managed Care – PPO | Source: Ambulatory Visit | Attending: Interventional Cardiology | Admitting: Interventional Cardiology

## 2016-05-16 DIAGNOSIS — I214 Non-ST elevation (NSTEMI) myocardial infarction: Secondary | ICD-10-CM | POA: Diagnosis not present

## 2016-05-16 NOTE — Progress Notes (Signed)
Daily Session Note  Patient Details  Name: Paul Sullivan MRN: [id number] Date of Birth: 13-Nov-1971 Referring Provider:    Encounter Date: 05/16/2016  Check In:     Session Check In - 05/16/16 1545    Check-In   Location [facility]   Staff Present Suzanne Boron, BS, EP, Exercise Physiologist;Diane Coad, MS, EP, [facility], Exercise Physiologist   Supervising physician immediately available to respond to emergencies See telemetry face sheet for immediately available MD   Medication changes reported     No   Fall or balance concerns reported    No   Warm-up and Cool-down Performed as group-led instruction   Resistance Training Performed Yes   VAD Patient? No   Pain Assessment   Currently in Pain? No/denies   Pain Score 0-No pain   Multiple Pain Sites No      Capillary Blood Glucose: No results found for this or any previous visit (from the past 24 hour(s)).      Exercise Prescription Changes - 05/16/16 1500    Exercise Review   Progression Yes   Response to Exercise   Blood Pressure (Admit) 100/60 mmHg   Blood Pressure (Exercise) 142/70 mmHg   Blood Pressure (Exit) 98/60 mmHg   Heart Rate (Admit) 68 bpm   Heart Rate (Exercise) 153 bpm   Heart Rate (Exit) 80 bpm   Rating of Perceived Exertion (Exercise) 14   Symptoms None   Duration Progress to 30 minutes of continuous aerobic without signs/symptoms of physical distress   Intensity Rest + 30   Progression   Progression Continue to progress workloads to maintain intensity without signs/symptoms of physical distress.   Resistance Training   Training Prescription Yes   Weight 5   Reps 10-12   Interval Training   Interval Training No   Treadmill   MPH 3.4   Grade 1.5   Minutes 20   METs 3.7   NuStep   Level 3   Watts 128   Minutes 15   METs 6.5   Elliptical   Level 3   Minutes 15   METs 5.4   Home Exercise Plan   Plans to continue exercise at Home   Frequency Add 2 additional days to  program exercise sessions.     Goals Met:  Independence with exercise equipment Exercise tolerated well No report of cardiac concerns or symptoms Strength training completed today  Goals Unmet:  Not Applicable  Comments: Check out [phone]   Dr. Kate Sable is Medical Director for [place] and Pulmonary Rehab.

## 2016-05-19 ENCOUNTER — Encounter (HOSPITAL_COMMUNITY)
Admission: RE | Admit: 2016-05-19 | Discharge: 2016-05-19 | Disposition: A | Discharge status: Home / Self Care | Payer: BC Managed Care – PPO | Source: Ambulatory Visit | Attending: Interventional Cardiology | Admitting: Interventional Cardiology

## 2016-05-19 DIAGNOSIS — I214 Non-ST elevation (NSTEMI) myocardial infarction: Secondary | ICD-10-CM | POA: Diagnosis not present

## 2016-05-19 NOTE — Progress Notes (Signed)
Daily Session Note  Patient Details  Name: KAIDAN HARPSTER MRN: [id number] Date of Birth: 1971-11-28 Referring Provider:    Encounter Date: 05/19/2016  Check In:     Session Check In - 05/19/16 1545    Check-In   Location [facility]   Staff Present Diane Angelina Pih, MS, EP, [facility], Exercise Physiologist;Pat Elicker Luther Parody, BS, EP, Exercise Physiologist   Supervising physician immediately available to respond to emergencies See telemetry face sheet for immediately available MD   Medication changes reported     No   Fall or balance concerns reported    No   Warm-up and Cool-down Performed as group-led instruction   Resistance Training Performed Yes   VAD Patient? No   Pain Assessment   Currently in Pain? No/denies   Pain Score 0-No pain   Multiple Pain Sites No      Capillary Blood Glucose: No results found for this or any previous visit (from the past 24 hour(s)).   Goals Met:  Independence with exercise equipment Exercise tolerated well No report of cardiac concerns or symptoms Strength training completed today  Goals Unmet:  Not Applicable  Comments: Check out [phone]   Dr. Kate Sable is Medical Director for [place] and Pulmonary Rehab.

## 2016-05-21 ENCOUNTER — Encounter (HOSPITAL_COMMUNITY): Payer: BC Managed Care – PPO

## 2016-05-23 ENCOUNTER — Encounter (HOSPITAL_COMMUNITY)
Admission: RE | Admit: 2016-05-23 | Discharge: 2016-05-23 | Disposition: A | Discharge status: Home / Self Care | Payer: BC Managed Care – PPO | Source: Ambulatory Visit | Attending: Interventional Cardiology | Admitting: Interventional Cardiology

## 2016-05-23 DIAGNOSIS — I214 Non-ST elevation (NSTEMI) myocardial infarction: Secondary | ICD-10-CM | POA: Diagnosis not present

## 2016-05-23 NOTE — Progress Notes (Signed)
Daily Session Note  Patient Details  Name: Paul Sullivan MRN: [id number] Date of Birth: 1971/06/27 Referring Provider:    Encounter Date: 05/23/2016  Check In:     Session Check In - 05/23/16 1545    Check-In   Location [facility]   Staff Present Diane Angelina Pih, MS, EP, [facility], Exercise Physiologist;Kissa Campoy Luther Parody, BS, EP, Exercise Physiologist   Supervising physician immediately available to respond to emergencies See telemetry face sheet for immediately available MD   Medication changes reported     No   Fall or balance concerns reported    No   Warm-up and Cool-down Performed as group-led instruction   Resistance Training Performed Yes   VAD Patient? No   Pain Assessment   Currently in Pain? No/denies   Pain Score 0-No pain   Multiple Pain Sites No      Capillary Blood Glucose: No results found for this or any previous visit (from the past 24 hour(s)).   Goals Met:  Independence with exercise equipment Exercise tolerated well No report of cardiac concerns or symptoms Strength training completed today  Goals Unmet:  Not Applicable  Comments: Check out [phone]   Dr. Kate Sable is Medical Director for [place] and Pulmonary Rehab.

## 2016-05-26 ENCOUNTER — Encounter (HOSPITAL_COMMUNITY)
Admission: RE | Admit: 2016-05-26 | Discharge: 2016-05-26 | Disposition: A | Discharge status: Home / Self Care | Payer: BC Managed Care – PPO | Source: Ambulatory Visit | Attending: Interventional Cardiology | Admitting: Interventional Cardiology

## 2016-05-26 DIAGNOSIS — I214 Non-ST elevation (NSTEMI) myocardial infarction: Secondary | ICD-10-CM | POA: Diagnosis not present

## 2016-05-26 NOTE — Progress Notes (Signed)
Daily Session Note  Patient Details  Name: Paul Sullivan MRN: [id number] Date of Birth: 1971-11-04 Referring Provider:    Encounter Date: 05/26/2016  Check In:     Session Check In - 05/26/16 0858    Check-In   Location [facility]   Staff Present Suzanne Boron, BS, EP, Exercise Physiologist;Christy Oletta Lamas, RN, BSN   Supervising physician immediately available to respond to emergencies See telemetry face sheet for immediately available MD   Medication changes reported     No   Fall or balance concerns reported    No   Warm-up and Cool-down Performed as group-led instruction   Resistance Training Performed Yes   VAD Patient? No   Pain Assessment   Currently in Pain? No/denies   Pain Score 0-No pain   Multiple Pain Sites No      Capillary Blood Glucose: No results found for this or any previous visit (from the past 24 hour(s)).   Goals Met:  Independence with exercise equipment Exercise tolerated well No report of cardiac concerns or symptoms Strength training completed today  Goals Unmet:  Not Applicable  Comments: Check out [phone]   Dr. Kate Sable is Medical Director for [place] and Pulmonary Rehab.

## 2016-05-26 NOTE — Progress Notes (Signed)
Cardiac Individual Treatment Plan  Patient Details  Name: Paul Sullivan MRN: [id number] Date of Birth: 1971/08/21 Referring Provider:    Initial Encounter Date:       CARDIAC REHAB PHASE II ORIENTATION from 03/10/2016 in [place]   Date  03/10/16      Visit Diagnosis: No diagnosis found.  Patient's Home Medications on Admission:  Current outpatient prescriptions:  .  aspirin 81 MG chewable tablet, Chew 1 tablet (81 mg total) by mouth daily., Disp: 30 tablet, Rfl: 11 .  atorvastatin (LIPITOR) 80 MG tablet, Take 1 tablet (80 mg total) by mouth daily at 6 PM., Disp: 30 tablet, Rfl: 6 .  metoprolol tartrate (LOPRESSOR) 25 MG tablet, Take 1 tablet (25 mg total) by mouth 2 (two) times daily., Disp: 60 tablet, Rfl: 11 .  nitroGLYCERIN (NITROSTAT) 0.4 MG SL tablet, Place 1 tablet (0.4 mg total) under the tongue every 5 (five) minutes x 3 doses as needed for chest pain., Disp: 25 tablet, Rfl: 12 .  ramipril (ALTACE) 2.5 MG capsule, Take 1 capsule (2.5 mg total) by mouth daily., Disp: 30 capsule, Rfl: 6 .  ticagrelor (BRILINTA) 90 MG TABS tablet, Take 1 tablet (90 mg total) by mouth 2 (two) times daily., Disp: 180 tablet, Rfl: 3  Past Medical History: Past Medical History  Diagnosis Date  . Dyslipidemia, goal LDL below 70 02/07/2016  . CAD (coronary artery disease)     a. Inf STEMI 3/17 - LHC pLAD 80, dLAD 50/60, oLCx 30, mRCA 100, 50, Normal LVF >> PCI - DES to RCA;  b. OP nuc stress study to assess LAD territory ischemia    Tobacco Use: History  Smoking status  . Never Smoker   Smokeless tobacco  . Never Used    Labs:     Recent Review Flowsheet Data    Labs for ITP Cardiac and Pulmonary Rehab Latest Ref Rng 02/05/2016 02/06/2016   Cholestrol 0 - 200 mg/dL 171 161   LDLCALC 0 - 99 mg/dL 123(H) 112(H)   HDL >40 mg/dL 27(L) 25(L)   Trlycerides <150 mg/dL 105 122   Hemoglobin A1c 4.8 - 5.6 % 5.8(H) -      Capillary Blood Glucose: No results found  for: GLUCAP   Exercise Target Goals:    Exercise Program Goal: Individual exercise prescription set with THRR, safety & activity barriers. Participant demonstrates ability to understand and report RPE using BORG scale, to self-measure pulse accurately, and to acknowledge the importance of the exercise prescription.  Exercise Prescription Goal: Starting with aerobic activity 30 plus minutes a day, 3 days per week for initial exercise prescription. Provide home exercise prescription and guidelines that participant acknowledges understanding prior to discharge.  Activity Barriers & Risk Stratification:     Activity Barriers & Cardiac Risk Stratification - 03/10/16 1213    Activity Barriers & Cardiac Risk Stratification   Activity Barriers None   Cardiac Risk Stratification High      6 Minute Walk:     6 Minute Walk      03/11/16 1401       6 Minute Walk   Phase Initial     Distance 1600 feet     Walk Time 8 minutes     # of Rest Breaks 1     MPH 3.03     METS 3.32     RPE 12     Perceived Dyspnea  9     VO2 Peak 17.[place]  Symptoms No     Resting HR 73 bpm     Resting BP 98/68 mmHg     Max Ex. HR 97 bpm     Max Ex. BP 120/70 mmHg     2 Minute Post BP 90/64 mmHg        Initial Exercise Prescription:     Initial Exercise Prescription - 03/10/16 1044    Date of Initial Exercise RX and Referring Provider   Date 03/10/16   Treadmill   MPH 2   Grade 0   Minutes 15   METs 2.53   Elliptical   Level 1   Minutes 15   METs 1.5   Prescription Details   Frequency (times per week) 3   Duration Progress to 30 minutes of continuous aerobic without signs/symptoms of physical distress   Intensity   THRR REST +  30   THRR 40-80% of Max Heartrate 114-135   Ratings of Perceived Exertion 11-13   Perceived Dyspnea 0-4   Progression   Progression Continue to progress workloads to maintain intensity without signs/symptoms of physical distress.   Resistance Training    Training Prescription Yes   Weight 1   Reps 8-10      Perform Capillary Blood Glucose checks as needed.  Exercise Prescription Changes:      Exercise Prescription Changes      03/29/16 2000 04/28/16 1200 05/07/16 1400 05/16/16 1500 05/22/16 1300   Exercise Review   Progression Yes Yes Yes Yes Yes   Response to Exercise   Blood Pressure (Admit) 92/60 mmHg 100/64 mmHg 100/60 mmHg 100/60 mmHg 100/62 mmHg   Blood Pressure (Exercise) 110/82 mmHg 148/78 mmHg 142/70 mmHg 142/70 mmHg 136/76 mmHg   Blood Pressure (Exit) 90/58 mmHg 98/62 mmHg 98/60 mmHg 98/60 mmHg 102/74 mmHg   Heart Rate (Admit) 81 bpm 75 bpm 68 bpm 68 bpm 77 bpm   Heart Rate (Exercise) 114 bpm 138 bpm 153 bpm 153 bpm 130 bpm   Heart Rate (Exit) 87 bpm 90 bpm 80 bpm 80 bpm 80 bpm   Rating of Perceived Exertion (Exercise) 13 14 14 14 14    Symptoms None None None None None   Duration Progress to 30 minutes of continuous aerobic without signs/symptoms of physical distress Progress to 30 minutes of continuous aerobic without signs/symptoms of physical distress Progress to 30 minutes of continuous aerobic without signs/symptoms of physical distress Progress to 30 minutes of continuous aerobic without signs/symptoms of physical distress Progress to 30 minutes of continuous aerobic without signs/symptoms of physical distress   Intensity Rest + 30 Rest + 30 Rest + 30 Rest + 30 Rest + 30   Progression   Progression Continue to progress workloads to maintain intensity without signs/symptoms of physical distress. Continue to progress workloads to maintain intensity without signs/symptoms of physical distress. Continue to progress workloads to maintain intensity without signs/symptoms of physical distress. Continue to progress workloads to maintain intensity without signs/symptoms of physical distress. Continue to progress workloads to maintain intensity without signs/symptoms of physical distress.   Resistance Training   Training  Prescription Yes Yes Yes Yes Yes   Weight 4 5 5 5 5    Reps 10-12 10-12 10-12 10-12 10-12   Interval Training   Interval Training No No No No No   Treadmill   MPH 2.5 3 3.3 3.4 3.4   Grade 1.5 1 1.5 1.5 1.5   Minutes 20 20 20 20 20    METs 3.41 3.7 3.7 3.7 3.7  NuStep   Level 2 3 3 3 3    Watts 88 128 128 128 128   Minutes 15 15 15 15 15    METs 3.61 6.5 6.5 6.5 6.5   Elliptical   Level  0 3 3 3    Minutes  0 15 15 15    METs  0 5.4 5.4 5.4   Home Exercise Plan   Plans to continue exercise at [place]   Frequency Add 2 additional days to program exercise sessions. Add 2 additional days to program exercise sessions. Add 2 additional days to program exercise sessions. Add 2 additional days to program exercise sessions. Add 2 additional days to program exercise sessions.      Exercise Comments:      Exercise Comments      03/29/16 2040 04/28/16 1215         Exercise Comments Patient is progressing appropriately Patient is progressing appropriately. Patient has lost 8.2lbs in 15 sessions by changing his diet and being conscious of his portion sizes.           Discharge Exercise Prescription (Final Exercise Prescription Changes):     Exercise Prescription Changes - 05/22/16 1300    Exercise Review   Progression Yes   Response to Exercise   Blood Pressure (Admit) 100/62 mmHg   Blood Pressure (Exercise) 136/76 mmHg   Blood Pressure (Exit) 102/74 mmHg   Heart Rate (Admit) 77 bpm   Heart Rate (Exercise) 130 bpm   Heart Rate (Exit) 80 bpm   Rating of Perceived Exertion (Exercise) 14   Symptoms None   Duration Progress to 30 minutes of continuous aerobic without signs/symptoms of physical distress   Intensity Rest + 30   Progression   Progression Continue to progress workloads to maintain intensity without signs/symptoms of physical distress.   Resistance Training   Training Prescription Yes   Weight 5   Reps 10-12   Interval Training   Interval Training  No   Treadmill   MPH 3.4   Grade 1.5   Minutes 20   METs 3.7   NuStep   Level 3   Watts 128   Minutes 15   METs 6.5   Elliptical   Level 3   Minutes 15   METs 5.4   Home Exercise Plan   Plans to continue exercise at Home   Frequency Add 2 additional days to program exercise sessions.      Nutrition:  Target Goals: Understanding of nutrition guidelines, daily intake of sodium 1500mg , cholesterol 200mg , calories 30% from fat and 7% or less from saturated fats, daily to have 5 or more servings of fruits and vegetables.  Biometrics:     Pre Biometrics - 03/11/16 1732    Pre Biometrics   Waist Circumference 39 inches   Hip Circumference 39 inches   Waist to Hip Ratio 1 %   Triceps Skinfold 6 mm   % Body Fat 21.6 %   Grip Strength 83 kg   Flexibility 0 in  tried unable to reach toes   Single Leg Stand 61 seconds       Nutrition Therapy Plan and Nutrition Goals:     Nutrition Therapy & Goals - 03/10/16 1221    Personal Nutrition Goals   Personal Goal #1 Continue to eat a Paleo Diet/ Mediiterranean Diet   Intervention Plan   Expected Outcomes Short Term Goal: Understand basic principles of dietary content, such as calories, fat, sodium, cholesterol and nutrients.  He is satisfied with the Paleo/Metiterranean diet that he has set for himself. Plans on sticking with that at this time.       Nutrition Discharge: Rate Your Plate Scores:     Nutrition Assessments - 03/10/16 1226    MEDFICTS Scores   Pre Score 13      Nutrition Goals Re-Evaluation:   Psychosocial: Target Goals: Acknowledge presence or absence of depression, maximize coping skills, provide positive support system. Participant is able to verbalize types and ability to use techniques and skills needed for reducing stress and depression.  Initial Review & Psychosocial Screening:     Initial Psych Review & Screening - 03/28/16 [place]? Yes   Barriers    Psychosocial barriers to participate in program There are no identifiable barriers or psychosocial needs.   Screening Interventions   Interventions Encouraged to exercise      Quality of Life Scores:     Quality of Life - 03/11/16 1735    Quality of Life Scores   Health/Function Pre 26.8 %   Socioeconomic Pre 29.25 %   Psych/Spiritual Pre 30 %   Family Pre 27.6 %   GLOBAL Pre 28.11 %      PHQ-9:     Recent Review Flowsheet Data    Depression screen [facility] 2/9 03/10/2016   Decreased Interest 0   Down, Depressed, Hopeless 0   PHQ - 2 Score 0   Altered sleeping 0   Tired, decreased energy 0   Change in appetite 1   Feeling bad or failure about yourself  0   Trouble concentrating 0   Moving slowly or fidgety/restless 0   Suicidal thoughts 0   PHQ-9 Score 1   Difficult doing work/chores Not difficult at all      Psychosocial Evaluation and Intervention:     Psychosocial Evaluation - 03/10/16 1232    Psychosocial Evaluation & Interventions   Interventions Encouraged to exercise with the program and follow exercise prescription   Continued Psychosocial Services Needed No      Psychosocial Re-Evaluation:     Psychosocial Re-Evaluation      03/28/16 1348 04/18/16 1635 05/26/16 1135       Psychosocial Re-Evaluation   Interventions Encouraged to attend Cardiac Rehabilitation for the exercise Encouraged to attend Cardiac Rehabilitation for the exercise Relaxation education;Encouraged to attend Cardiac Rehabilitation for the exercise;Stress management education     Continued Psychosocial Services Needed No No No        Vocational Rehabilitation: Provide vocational rehab assistance to qualifying candidates.   Vocational Rehab Evaluation & Intervention:     Vocational Rehab - 03/10/16 1220    Initial Vocational Rehab Evaluation & Intervention   Assessment shows need for Vocational Rehabilitation No      Education: Education Goals: Education classes will be  provided on a weekly basis, covering required topics. Participant will state understanding/return demonstration of topics presented.  Learning Barriers/Preferences:     Learning Barriers/Preferences - 03/10/16 1220    Learning Barriers/Preferences   Learning Barriers None   Learning Preferences Skilled Demonstration  Doing      Education Topics: Hypertension, Hypertension Reduction -Define heart disease and high blood pressure. Discus how high blood pressure affects the body and ways to reduce high blood pressure.      CARDIAC REHAB PHASE II EXERCISE from 05/14/2016 in [place]   Date  04/02/16   Educator  Russella Dar   Instruction  Review Code  2- meets goals/outcomes      Exercise and Your Heart -Discuss why it is important to exercise, the FITT principles of exercise, normal and abnormal responses to exercise, and how to exercise safely.      CARDIAC REHAB PHASE II EXERCISE from 05/14/2016 in [place]   Date  04/09/16   Educator  DC   Instruction Review Code  2- meets goals/outcomes      Angina -Discuss definition of angina, causes of angina, treatment of angina, and how to decrease risk of having angina.   Cardiac Medications -Review what the following cardiac medications are used for, how they affect the body, and side effects that may occur when taking the medications.  Medications include Aspirin, Beta blockers, calcium channel blockers, ACE Inhibitors, angiotensin receptor blockers, diuretics, digoxin, and antihyperlipidemics.      CARDIAC REHAB PHASE II EXERCISE from 05/14/2016 in [place]   Date  04/23/16   Educator  D. Coad   Instruction Review Code  2- meets goals/outcomes      Congestive Heart Failure -Discuss the definition of CHF, how to live with CHF, the signs and symptoms of CHF, and how keep track of weight and sodium intake.      CARDIAC REHAB PHASE II EXERCISE from 05/14/2016 in  [place]   Date  04/30/16   Educator  DC   Instruction Review Code  2- meets goals/outcomes      Heart Disease and Intimacy -Discus the effect sexual activity has on the heart, how changes occur during intimacy as we age, and safety during sexual activity.      CARDIAC REHAB PHASE II EXERCISE from 05/14/2016 in [place]   Date  05/07/16   Educator  DC   Instruction Review Code  2- meets goals/outcomes      Smoking Cessation / COPD -Discuss different methods to quit smoking, the health benefits of quitting smoking, and the definition of COPD.      CARDIAC REHAB PHASE II EXERCISE from 05/14/2016 in [place]   Date  05/14/16   Educator  Russella Dar   Instruction Review Code  2- meets goals/outcomes      Nutrition I: Fats -Discuss the types of cholesterol, what cholesterol does to the heart, and how cholesterol levels can be controlled.   Nutrition II: Labels -Discuss the different components of food labels and how to read food label   Heart Parts and Heart Disease -Discuss the anatomy of the heart, the pathway of blood circulation through the heart, and these are affected by heart disease.   Stress I: Signs and Symptoms -Discuss the causes of stress, how stress may lead to anxiety and depression, and ways to limit stress.   Stress II: Relaxation -Discuss different types of relaxation techniques to limit stress.   Warning Signs of Stroke / TIA -Discuss definition of a stroke, what the signs and symptoms are of a stroke, and how to identify when someone is having stroke.          CARDIAC REHAB PHASE II EXERCISE from 05/14/2016 in [place]   Date  03/26/16   Educator  Russella Dar   Instruction Review Code  2- meets goals/outcomes      Knowledge Questionnaire Score:     Knowledge Questionnaire Score - 03/10/16 1220    Knowledge Questionnaire Score   Pre Score 20/24       Core  Components/Risk Factors/Patient Goals at Admission:     Personal Goals and Risk Factors at Admission - 03/10/16 1228    Core Components/Risk Factors/Patient Goals on Admission   Personal Goal Other Yes   Personal Goal Increase activity level   Intervention Be able to get back to hiking at least 5 miles   Expected Outcomes Lose about 10lbs before graduation      Core Components/Risk Factors/Patient Goals Review:      Goals and Risk Factor Review      03/28/16 1345 04/18/16 1634 05/26/16 1134       Core Components/Risk Factors/Patient Goals Review   Personal Goals Review Weight Management/Obesity;Lipids;Other  back to hiking, increase activity Weight Management/Obesity;Lipids;Other Weight Management/Obesity;Lipids;Other     Review No progress due to only 3 visits into the program at this time. Will continue progress patient on machines and educate Will continue progress patient on machines and educate Will continue progress patient on machines and educate. pt states he feels a lot better and feels stronger. pt has lost 9 lbs since first starting program .     Expected Outcomes lose weight, increase activity lose weight, increase activity lose weight, increase activity        Core Components/Risk Factors/Patient Goals at Discharge (Final Review):      Goals and Risk Factor Review - 05/26/16 1134    Core Components/Risk Factors/Patient Goals Review   Personal Goals Review Weight Management/Obesity;Lipids;Other   Review Will continue progress patient on machines and educate. pt states he feels a lot better and feels stronger. pt has lost 9 lbs since first starting program .   Expected Outcomes lose weight, increase activity      ITP Comments:   Comments:

## 2016-05-28 ENCOUNTER — Encounter (HOSPITAL_COMMUNITY)
Admission: RE | Admit: 2016-05-28 | Discharge: 2016-05-28 | Disposition: A | Discharge status: Home / Self Care | Payer: BC Managed Care – PPO | Source: Ambulatory Visit | Attending: Interventional Cardiology | Admitting: Interventional Cardiology

## 2016-05-28 DIAGNOSIS — I214 Non-ST elevation (NSTEMI) myocardial infarction: Secondary | ICD-10-CM | POA: Diagnosis not present

## 2016-05-28 DIAGNOSIS — Z955 Presence of coronary angioplasty implant and graft: Secondary | ICD-10-CM

## 2016-05-28 DIAGNOSIS — I2119 ST elevation (STEMI) myocardial infarction involving other coronary artery of inferior wall: Secondary | ICD-10-CM

## 2016-05-28 NOTE — Progress Notes (Signed)
Daily Session Note  Patient Details  Name: KIERAN ARREGUIN MRN: [id number] Date of Birth: [birth year] Referring Provider:    Encounter Date: 05/28/2016  Check In:     Session Check In - 05/28/16 1545    Check-In   Location [facility]   Staff Present Keyonni Percival Angelina Pih, MS, EP, [facility], Exercise Physiologist;Gregory Luther Parody, BS, EP, Exercise Physiologist;Debra Wynetta Emery, RN, BSN   Supervising physician immediately available to respond to emergencies See telemetry face sheet for immediately available MD   Medication changes reported     No   Fall or balance concerns reported    No   Warm-up and Cool-down Performed as group-led instruction   Resistance Training Performed Yes   VAD Patient? No   Pain Assessment   Currently in Pain? No/denies   Pain Score 0-No pain   Multiple Pain Sites No      Capillary Blood Glucose: No results found for this or any previous visit (from the past 24 hour(s)).   Goals Met:  Independence with exercise equipment Exercise tolerated well No report of cardiac concerns or symptoms Strength training completed today  Goals Unmet:  Not Applicable  Comments: Pt able to follow exercise prescription today without complaint.  Will continue to monitor for progression. Check out: 4:45   Dr. Kate Sable is Medical Director for [place] and Pulmonary Rehab.

## 2016-05-30 ENCOUNTER — Encounter (HOSPITAL_COMMUNITY)
Admission: RE | Admit: 2016-05-30 | Discharge: 2016-05-30 | Disposition: A | Discharge status: Home / Self Care | Payer: BC Managed Care – PPO | Source: Ambulatory Visit | Attending: Interventional Cardiology | Admitting: Interventional Cardiology

## 2016-05-30 DIAGNOSIS — I214 Non-ST elevation (NSTEMI) myocardial infarction: Secondary | ICD-10-CM | POA: Diagnosis not present

## 2016-05-30 NOTE — Progress Notes (Signed)
Daily Session Note  Patient Details  Name: Paul Sullivan MRN: [id number] Date of Birth: 10-09-1971 Referring Provider:    Encounter Date: 05/30/2016  Check In:     Session Check In - 05/30/16 1545    Check-In   Location [facility]   Staff Present Diane Angelina Pih, MS, EP, [facility], Exercise Physiologist;Kameah Rawl Luther Parody, BS, EP, Exercise Physiologist;Debra Wynetta Emery, RN, BSN   Supervising physician immediately available to respond to emergencies See telemetry face sheet for immediately available MD   Medication changes reported     No   Fall or balance concerns reported    No   Warm-up and Cool-down Performed as group-led instruction   Resistance Training Performed Yes   VAD Patient? No   Pain Assessment   Currently in Pain? No/denies   Pain Score 0-No pain   Multiple Pain Sites No      Capillary Blood Glucose: No results found for this or any previous visit (from the past 24 hour(s)).   Goals Met:  Independence with exercise equipment Exercise tolerated well No report of cardiac concerns or symptoms Strength training completed today  Goals Unmet:  Not Applicable  Comments: Check out 445   Dr. Kate Sable is Medical Director for [place] and Pulmonary Rehab.

## 2016-06-02 ENCOUNTER — Encounter (HOSPITAL_COMMUNITY): Payer: BC Managed Care – PPO

## 2016-06-04 ENCOUNTER — Encounter (HOSPITAL_COMMUNITY): Payer: BC Managed Care – PPO

## 2016-06-06 ENCOUNTER — Encounter (HOSPITAL_COMMUNITY)
Admission: RE | Admit: 2016-06-06 | Discharge: 2016-06-06 | Disposition: A | Discharge status: Home / Self Care | Payer: BC Managed Care – PPO | Source: Ambulatory Visit | Attending: Interventional Cardiology | Admitting: Interventional Cardiology

## 2016-06-06 DIAGNOSIS — I214 Non-ST elevation (NSTEMI) myocardial infarction: Secondary | ICD-10-CM | POA: Diagnosis present

## 2016-06-06 DIAGNOSIS — Z955 Presence of coronary angioplasty implant and graft: Secondary | ICD-10-CM | POA: Insufficient documentation

## 2016-06-06 DIAGNOSIS — I2119 ST elevation (STEMI) myocardial infarction involving other coronary artery of inferior wall: Secondary | ICD-10-CM

## 2016-06-06 NOTE — Progress Notes (Signed)
Daily Session Note  Patient Details  Name: Paul Sullivan MRN: [id number] Date of Birth: 1970/12/28 Referring Provider:    Encounter Date: 06/06/2016  Check In:     Session Check In - 06/06/16 1545    Check-In   Location [facility]   Staff Present Diane Angelina Pih, MS, EP, [facility], Exercise Physiologist;Gregory Luther Parody, BS, EP, Exercise Physiologist;Shaqueena Mauceri Wynetta Emery, RN, BSN   Medication changes reported     No   Fall or balance concerns reported    No   Warm-up and Cool-down Performed as group-led instruction   Resistance Training Performed Yes   VAD Patient? No   Pain Assessment   Currently in Pain? No/denies   Pain Score 0-No pain   Multiple Pain Sites No      Capillary Blood Glucose: No results found for this or any previous visit (from the past 24 hour(s)).   Goals Met:  Independence with exercise equipment Exercise tolerated well No report of cardiac concerns or symptoms Strength training completed today  Goals Unmet:  Not Applicable  Comments: Check out 4:45.   Dr. Kate Sable is Medical Director for [facility] and [facility]monary Rehab.

## 2016-06-09 ENCOUNTER — Encounter (HOSPITAL_COMMUNITY)
Admission: RE | Admit: 2016-06-09 | Discharge: 2016-06-09 | Disposition: A | Discharge status: Home / Self Care | Payer: BC Managed Care – PPO | Source: Ambulatory Visit | Attending: Interventional Cardiology | Admitting: Interventional Cardiology

## 2016-06-09 DIAGNOSIS — I214 Non-ST elevation (NSTEMI) myocardial infarction: Secondary | ICD-10-CM | POA: Diagnosis not present

## 2016-06-10 NOTE — Progress Notes (Signed)
Daily Session Note  Patient Details  Name: Paul Sullivan MRN: [id number] Date of Birth: Jun 29, 1971 Referring Provider:    Encounter Date: 06/09/2016  Check In:   Capillary Blood Glucose: No results found for this or any previous visit (from the past 24 hour(s)).   Goals Met:  Independence with exercise equipment Exercise tolerated well No report of cardiac concerns or symptoms Strength training completed today  Goals Unmet:  Not Applicable  Comments: Check out: 4:45   Dr. Kate Sable is Medical Director for [place] and Pulmonary Rehab.

## 2016-06-11 ENCOUNTER — Encounter (HOSPITAL_COMMUNITY): Payer: BC Managed Care – PPO

## 2016-06-13 ENCOUNTER — Encounter (HOSPITAL_COMMUNITY)
Admission: RE | Admit: 2016-06-13 | Discharge: 2016-06-13 | Disposition: A | Discharge status: Home / Self Care | Payer: BC Managed Care – PPO | Source: Ambulatory Visit | Attending: Interventional Cardiology | Admitting: Interventional Cardiology

## 2016-06-13 DIAGNOSIS — I214 Non-ST elevation (NSTEMI) myocardial infarction: Secondary | ICD-10-CM | POA: Diagnosis not present

## 2016-06-16 ENCOUNTER — Encounter (HOSPITAL_COMMUNITY)
Admission: RE | Admit: 2016-06-16 | Discharge: 2016-06-16 | Disposition: A | Discharge status: Home / Self Care | Payer: BC Managed Care – PPO | Source: Ambulatory Visit | Attending: Interventional Cardiology | Admitting: Interventional Cardiology

## 2016-06-16 DIAGNOSIS — I214 Non-ST elevation (NSTEMI) myocardial infarction: Secondary | ICD-10-CM | POA: Diagnosis not present

## 2016-06-16 DIAGNOSIS — I2119 ST elevation (STEMI) myocardial infarction involving other coronary artery of inferior wall: Secondary | ICD-10-CM

## 2016-06-16 DIAGNOSIS — Z955 Presence of coronary angioplasty implant and graft: Secondary | ICD-10-CM

## 2016-06-16 NOTE — Progress Notes (Signed)
Daily Session Note  Patient Details  Name: Paul Sullivan MRN: [id number] Date of Birth: 1970-12-13 Referring Provider:    Encounter Date: 06/16/2016  Check In:     Session Check In - 06/16/16 1545    Check-In   Location [facility]   Staff Present Milo Schreier Angelina Pih, MS, EP, [facility], Exercise Physiologist;Debra Wynetta Emery, RN, BSN   Supervising physician immediately available to respond to emergencies See telemetry face sheet for immediately available MD   Medication changes reported     No   Fall or balance concerns reported    No   Warm-up and Cool-down Performed as group-led instruction   Resistance Training Performed Yes   VAD Patient? No   VAD patient   Has back up controller? No   Pain Assessment   Currently in Pain? No/denies   Pain Score 0-No pain   Multiple Pain Sites No      Capillary Blood Glucose: No results found for this or any previous visit (from the past 24 hour(s)).   Goals Met:  Independence with exercise equipment Exercise tolerated well No report of cardiac concerns or symptoms Strength training completed today  Goals Unmet:  Not Applicable  Comments: Check out: 4:45   Dr. Kate Sable is Medical Director for [place] and Pulmonary Rehab.

## 2016-06-18 ENCOUNTER — Encounter (HOSPITAL_COMMUNITY)
Admission: RE | Admit: 2016-06-18 | Discharge: 2016-06-18 | Disposition: A | Discharge status: Home / Self Care | Payer: BC Managed Care – PPO | Source: Ambulatory Visit | Attending: Interventional Cardiology | Admitting: Interventional Cardiology

## 2016-06-18 DIAGNOSIS — I2119 ST elevation (STEMI) myocardial infarction involving other coronary artery of inferior wall: Secondary | ICD-10-CM

## 2016-06-18 DIAGNOSIS — Z955 Presence of coronary angioplasty implant and graft: Secondary | ICD-10-CM

## 2016-06-18 DIAGNOSIS — I214 Non-ST elevation (NSTEMI) myocardial infarction: Secondary | ICD-10-CM | POA: Diagnosis not present

## 2016-06-18 NOTE — Progress Notes (Signed)
Daily Session Note  Patient Details  Name: Paul Sullivan MRN: [id number] Date of Birth: 30-Oct-1971 Referring Provider:    Encounter Date: 06/18/2016  Check In:     Session Check In - 06/18/16 0815    Check-In   Location [facility]   Staff Present Diane Angelina Pih, MS, EP, [facility], Exercise Physiologist;Serenidy Waltz Luther Parody, BS, EP, Exercise Physiologist;Debra Wynetta Emery, RN, BSN   Supervising physician immediately available to respond to emergencies See telemetry face sheet for immediately available MD   Medication changes reported     No   Fall or balance concerns reported    No   Warm-up and Cool-down Performed as group-led instruction   Resistance Training Performed Yes   VAD Patient? No   VAD patient   Has back up controller? No   Pain Assessment   Currently in Pain? No/denies   Pain Score 0-No pain   Multiple Pain Sites No      Capillary Blood Glucose: No results found for this or any previous visit (from the past 24 hour(s)).   Goals Met:  Independence with exercise equipment Exercise tolerated well No report of cardiac concerns or symptoms Strength training completed today  Goals Unmet:  Not Applicable  Comments: Check out [phone]   Dr. Kate Sable is Medical Director for [place] and Pulmonary Rehab.

## 2016-06-20 ENCOUNTER — Encounter (HOSPITAL_COMMUNITY)
Admission: RE | Admit: 2016-06-20 | Discharge: 2016-06-20 | Disposition: A | Discharge status: Home / Self Care | Payer: BC Managed Care – PPO | Source: Ambulatory Visit | Attending: Interventional Cardiology | Admitting: Interventional Cardiology

## 2016-06-20 DIAGNOSIS — I214 Non-ST elevation (NSTEMI) myocardial infarction: Secondary | ICD-10-CM | POA: Diagnosis not present

## 2016-06-20 DIAGNOSIS — I2119 ST elevation (STEMI) myocardial infarction involving other coronary artery of inferior wall: Secondary | ICD-10-CM

## 2016-06-20 DIAGNOSIS — Z955 Presence of coronary angioplasty implant and graft: Secondary | ICD-10-CM

## 2016-06-20 NOTE — Progress Notes (Signed)
Daily Session Note  Patient Details  Name: BONIFACIO PRUDEN MRN: [id number] Date of Birth: [birth year] Referring Provider:    Encounter Date: 06/20/2016  Check In:     Session Check In - 06/20/16 1545    Check-In   Location [facility]   Staff Present Jauan Wohl Angelina Pih, MS, EP, [facility], Exercise Physiologist;Debra Wynetta Emery, RN, BSN   Supervising physician immediately available to respond to emergencies See telemetry face sheet for immediately available MD   Medication changes reported     No   Fall or balance concerns reported    No   Warm-up and Cool-down Performed as group-led instruction   Resistance Training Performed Yes   VAD Patient? No   VAD patient   Has back up controller? No   Pain Assessment   Currently in Pain? No/denies   Pain Score 0-No pain   Multiple Pain Sites No      Capillary Blood Glucose: No results found for this or any previous visit (from the past 24 hour(s)).   Goals Met:  Independence with exercise equipment Exercise tolerated well No report of cardiac concerns or symptoms Strength training completed today  Goals Unmet:  Not Applicable  Comments: Check out: 4:45   Dr. Kate Sable is Medical Director for [place] and Pulmonary Rehab.

## 2016-06-23 ENCOUNTER — Encounter (HOSPITAL_COMMUNITY)
Admission: RE | Admit: 2016-06-23 | Discharge: 2016-06-23 | Disposition: A | Discharge status: Home / Self Care | Payer: BC Managed Care – PPO | Source: Ambulatory Visit | Attending: Interventional Cardiology | Admitting: Interventional Cardiology

## 2016-06-23 DIAGNOSIS — I214 Non-ST elevation (NSTEMI) myocardial infarction: Secondary | ICD-10-CM | POA: Diagnosis not present

## 2016-06-23 DIAGNOSIS — Z955 Presence of coronary angioplasty implant and graft: Secondary | ICD-10-CM

## 2016-06-23 DIAGNOSIS — I2119 ST elevation (STEMI) myocardial infarction involving other coronary artery of inferior wall: Secondary | ICD-10-CM

## 2016-06-25 ENCOUNTER — Encounter (HOSPITAL_COMMUNITY)
Admission: RE | Admit: 2016-06-25 | Discharge: 2016-06-25 | Disposition: A | Discharge status: Home / Self Care | Payer: BC Managed Care – PPO | Source: Ambulatory Visit | Attending: Interventional Cardiology | Admitting: Interventional Cardiology

## 2016-06-25 VITALS — Ht 74.0 in | Wt 195.1 lb

## 2016-06-25 DIAGNOSIS — I2119 ST elevation (STEMI) myocardial infarction involving other coronary artery of inferior wall: Secondary | ICD-10-CM

## 2016-06-25 DIAGNOSIS — Z955 Presence of coronary angioplasty implant and graft: Secondary | ICD-10-CM

## 2016-06-25 DIAGNOSIS — I214 Non-ST elevation (NSTEMI) myocardial infarction: Secondary | ICD-10-CM | POA: Diagnosis not present

## 2016-06-25 NOTE — Progress Notes (Signed)
Daily Session Note  Patient Details  Name: MALVERN KADLEC MRN: [id number] Date of Birth: January 08, 1971 Referring Provider:    Encounter Date: 06/25/2016  Check In:     Session Check In - 06/25/16 1545      Check-In   Location [facility]   Staff Present Suzanne Boron, BS, EP, Exercise Physiologist;Dorathy Stallone Wynetta Emery, RN, BSN   Supervising physician immediately available to respond to emergencies See telemetry face sheet for immediately available MD   Medication changes reported     No   Fall or balance concerns reported    No   Warm-up and Cool-down Performed as group-led instruction   Resistance Training Performed Yes   VAD Patient? No     VAD patient   Has back up controller? No     Pain Assessment   Currently in Pain? No/denies   Pain Score 0-No pain   Multiple Pain Sites No      Capillary Blood Glucose: No results found for this or any previous visit (from the past 24 hour(s)).   Goals Met:  Independence with exercise equipment Exercise tolerated well No report of cardiac concerns or symptoms Strength training completed today  Goals Unmet:  Not Applicable  Comments: Check out 1645.   Dr. Kate Sable is Medical Director for [facility] and Pulmonary Rehab.

## 2016-06-25 NOTE — Progress Notes (Signed)
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  aspirin 81 MG chewable tablet, Chew 1 tablet (81 mg total) by mouth daily., Disp: 30 tablet, Rfl: 11 .  atorvastatin (LIPITOR) 80 MG tablet, Take 1 tablet (80 mg total) by mouth daily at 6 PM., Disp: 30 tablet, Rfl: 6 .  metoprolol tartrate (LOPRESSOR) 25 MG tablet, Take 1 tablet (25 mg total) by mouth 2 (two) times daily., Disp: 60 tablet, Rfl: 11 .  nitroGLYCERIN (NITROSTAT) 0.4 MG SL tablet, Place 1 tablet (0.4 mg total) under the tongue every 5 (five) minutes x 3 doses as needed for chest pain., Disp: 25 tablet, Rfl: 12 .  ramipril (ALTACE) 2.5 MG capsule, Take 1 capsule (2.5 mg total) by mouth daily., Disp: 30 capsule, Rfl: 6 .  ticagrelor (BRILINTA) 90 MG TABS tablet, Take 1 tablet (90 mg total) by mouth 2 (two) times daily., Disp: 180 tablet, Rfl: 3  Past Medical History: Past Medical History:  Diagnosis Date  . CAD (coronary artery disease)    a. Inf STEMI 3/17 - LHC pLAD 80, dLAD 50/60, oLCx 30, mRCA 100, 50, Normal LVF >> PCI - DES to RCA;  b. OP nuc stress study to assess LAD territory ischemia  . Dyslipidemia, goal LDL below 70 02/07/2016    Tobacco Use: History  Smoking Status  . Never Smoker  Smokeless Tobacco  . Never Used    Labs: Recent Review Flowsheet Data    Labs for ITP Cardiac and Pulmonary Rehab Latest Ref Rng & Units 02/05/2016 02/06/2016   Cholestrol 0 - 200 mg/dL 171 161   LDLCALC 0 - 99 mg/dL 123(H) 112(H)   HDL >40 mg/dL 27(L) 25(L)    Trlycerides <150 mg/dL 105 122   Hemoglobin A1c 4.8 - 5.6 % 5.8(H) -      Capillary Blood Glucose: No results found for: GLUCAP   Exercise Target Goals:    Exercise Program Goal: Individual exercise prescription set with THRR, safety & activity barriers. Participant demonstrates ability to understand and report RPE using BORG scale, to self-measure pulse accurately, and to acknowledge the importance of the exercise prescription.  Exercise Prescription Goal: Starting with aerobic activity 30 plus minutes a day, 3 days per week for initial exercise prescription. Provide home exercise prescription and guidelines that participant acknowledges understanding prior to discharge.  Activity Barriers & Risk Stratification:     Activity Barriers & Cardiac Risk Stratification - 03/10/16 1213      Activity Barriers & Cardiac Risk Stratification   Activity Barriers None   Cardiac Risk Stratification High      6 Minute Walk:     6 Minute Walk    Row Name 03/11/16 1401         6 Minute Walk   Phase Initial     Distance 1600 feet     Walk Time 8 minutes     # of Rest Breaks 1     MPH 3.03     METS 3.32     RPE 12     Perceived Dyspnea  9  VO2 Peak 17.22     Symptoms No     Resting HR 73 bpm     Resting BP 98/68     Max Ex. HR 97 bpm     Max Ex. BP 120/70     2 Minute Post BP 90/64        Initial Exercise Prescription:     Initial Exercise Prescription - 03/10/16 1044      Date of Initial Exercise RX and Referring Provider   Date 03/10/16     Treadmill   MPH 2   Grade 0   Minutes 15   METs 2.53     Elliptical   Level 1   Minutes 15   METs 1.5     Prescription Details   Frequency (times per week) 3   Duration Progress to 30 minutes of continuous aerobic without signs/symptoms of physical distress     Intensity   THRR REST +  30   THRR 40-80% of Max Heartrate 114-135   Ratings of Perceived Exertion 11-13   Perceived Dyspnea 0-4     Progression    Progression Continue to progress workloads to maintain intensity without signs/symptoms of physical distress.     Resistance Training   Training Prescription Yes   Weight 1   Reps 8-10      Perform Capillary Blood Glucose checks as needed.  Exercise Prescription Changes:      Exercise Prescription Changes    Row Name 03/29/16 2000 04/28/16 1200 05/07/16 1400 05/16/16 1500 05/22/16 1300     Exercise Review   Progression Yes Yes Yes Yes Yes     Response to Exercise   Blood Pressure (Admit) 92/60 100/64 100/60 100/60 100/62   Blood Pressure (Exercise) 110/82 148/78 142/70 142/70 136/76   Blood Pressure (Exit) 90/58 98/62 98/60  98/60 102/74   Heart Rate (Admit) 81 bpm 75 bpm 68 bpm 68 bpm 77 bpm   Heart Rate (Exercise) 114 bpm 138 bpm 153 bpm 153 bpm 130 bpm   Heart Rate (Exit) 87 bpm 90 bpm 80 bpm 80 bpm 80 bpm   Rating of Perceived Exertion (Exercise) 13 14 14 14 14    Symptoms None None None None None   Duration Progress to 30 minutes of continuous aerobic without signs/symptoms of physical distress Progress to 30 minutes of continuous aerobic without signs/symptoms of physical distress Progress to 30 minutes of continuous aerobic without signs/symptoms of physical distress Progress to 30 minutes of continuous aerobic without signs/symptoms of physical distress Progress to 30 minutes of continuous aerobic without signs/symptoms of physical distress   Intensity Rest + 30 Rest + 30 Rest + 30 Rest + 30 Rest + 30     Progression   Progression Continue to progress workloads to maintain intensity without signs/symptoms of physical distress. Continue to progress workloads to maintain intensity without signs/symptoms of physical distress. Continue to progress workloads to maintain intensity without signs/symptoms of physical distress. Continue to progress workloads to maintain intensity without signs/symptoms of physical distress. Continue to progress workloads to maintain intensity without  signs/symptoms of physical distress.     Resistance Training   Training Prescription Yes Yes Yes Yes Yes   Weight 4 5 5 5 5    Reps 10-12 10-12 10-12 10-12 10-12     Interval Training   Interval Training No No No No No     Treadmill   MPH 2.5 3 3.3 3.4 3.4   Grade 1.5 1 1.5 1.5 1.5   Minutes  20 20 20 20 20    METs 3.41 3.7 3.7 3.7 3.7     NuStep   Level 2 3 3 3 3    Watts 88 128 128 128 128   Minutes 15 15 15 15 15    METs 3.61 6.5 6.5 6.5 6.5     Elliptical   Level  - 0 3 3 3    Minutes  - 0 15 15 15    METs  - 0 5.4 5.4 5.4     Home Exercise Plan   Plans to continue exercise at [place]   Frequency Add 2 additional days to program exercise sessions. Add 2 additional days to program exercise sessions. Add 2 additional days to program exercise sessions. Add 2 additional days to program exercise sessions. Add 2 additional days to program exercise sessions.   [place] Name 06/02/16 1400 06/18/16 1400           Exercise Review   Progression Yes Yes        Response to Exercise   Blood Pressure (Admit) 98/60 100/62      Blood Pressure (Exercise) 130/82 120/62      Blood Pressure (Exit) 96/68 106/62      Heart Rate (Admit) 94 bpm 70 bpm      Heart Rate (Exercise) 147 bpm 117 bpm      Heart Rate (Exit) 94 bpm 84 bpm      Rating of Perceived Exertion (Exercise) 12 12      Symptoms None None      Duration Progress to 30 minutes of continuous aerobic without signs/symptoms of physical distress Progress to 30 minutes of continuous aerobic without signs/symptoms of physical distress      Intensity Rest + 30 Rest + 30        Progression   Progression Continue to progress workloads to maintain intensity without signs/symptoms of physical distress. Continue to progress workloads to maintain intensity without signs/symptoms of physical distress.        Resistance Training   Training Prescription Yes Yes      Weight 8.0 8.0      Reps 10-12 10-12        Interval Training    Interval Training No No        Treadmill   MPH 3.4 3.5      Grade 1.5 1.5      Minutes 20 20      METs 4.29 4.29        NuStep   Level 3 3      Watts 128 128      Minutes 15 15      METs 6.5 6.5        Elliptical   Level 1 1      Minutes 15 15      METs 3.4 3.1        Home Exercise Plan   Plans to continue exercise at [place] 2 additional days to program exercise sessions. Add 2 additional days to program exercise sessions.         Exercise Comments:      Exercise Comments    Row Name 03/29/16 2040 04/28/16 1215 06/24/16 0807       Exercise Comments Patient is progressing appropriately Patient is progressing appropriately. Patient has lost 8.2lbs in 15 sessions by changing his diet and being conscious of his portion sizes.  Patient is progressing appropriately. Patient has lost  10.5lbs in 35 sessions by changing his diet and being conscious of his portion sizes. He has reached his weight goal.          Discharge Exercise Prescription (Final Exercise Prescription Changes):     Exercise Prescription Changes - 06/18/16 1400      Exercise Review   Progression Yes     Response to Exercise   Blood Pressure (Admit) 100/62   Blood Pressure (Exercise) 120/62   Blood Pressure (Exit) 106/62   Heart Rate (Admit) 70 bpm   Heart Rate (Exercise) 117 bpm   Heart Rate (Exit) 84 bpm   Rating of Perceived Exertion (Exercise) 12   Symptoms None   Duration Progress to 30 minutes of continuous aerobic without signs/symptoms of physical distress   Intensity Rest + 30     Progression   Progression Continue to progress workloads to maintain intensity without signs/symptoms of physical distress.     Resistance Training   Training Prescription Yes   Weight 8.0   Reps 10-12     Interval Training   Interval Training No     Treadmill   MPH 3.5   Grade 1.5   Minutes 20   METs 4.29     NuStep   Level 3   Watts 128   Minutes 15   METs 6.5      Elliptical   Level 1   Minutes 15   METs 3.1     Home Exercise Plan   Plans to continue exercise at Home   Frequency Add 2 additional days to program exercise sessions.      Nutrition:  Target Goals: Understanding of nutrition guidelines, daily intake of sodium 1500mg , cholesterol 200mg , calories 30% from fat and 7% or less from saturated fats, daily to have 5 or more servings of fruits and vegetables.  Biometrics:     Pre Biometrics - 03/11/16 1732      Pre Biometrics   Waist Circumference 39 inches   Hip Circumference 39 inches   Waist to Hip Ratio 1 %   Triceps Skinfold 6 mm   % Body Fat 21.6 %   Grip Strength 83 kg   Flexibility 0 in  tried unable to reach toes   Single Leg Stand 61 seconds       Nutrition Therapy Plan and Nutrition Goals:     Nutrition Therapy & Goals - 03/10/16 1221      Personal Nutrition Goals   Personal Goal #1 Continue to eat a Paleo Diet/ Mediiterranean Diet     Intervention Plan   Expected Outcomes Short Term Goal: Understand basic principles of dietary content, such as calories, fat, sodium, cholesterol and nutrients.  He is satisfied with the Paleo/Metiterranean diet that he has set for himself. Plans on sticking with that at this time.       Nutrition Discharge: Rate Your Plate Scores:     Nutrition Assessments - 03/10/16 1226      MEDFICTS Scores   Pre Score 13      Nutrition Goals Re-Evaluation:   Psychosocial: Target Goals: Acknowledge presence or absence of depression, maximize coping skills, provide positive support system. Participant is able to verbalize types and ability to use techniques and skills needed for reducing stress and depression.  Initial Review & Psychosocial Screening:     Initial Psych Review & Screening - 03/28/16 [place]? Yes     Barriers   Psychosocial  barriers to participate in program There are no identifiable barriers or psychosocial needs.      Screening Interventions   Interventions Encouraged to exercise      Quality of Life Scores:     Quality of Life - 03/11/16 1735      Quality of Life Scores   Health/Function Pre 26.8 %   Socioeconomic Pre 29.25 %   Psych/Spiritual Pre 30 %   Family Pre 27.6 %   GLOBAL Pre 28.11 %      PHQ-9: Recent Review Flowsheet Data    Depression screen [facility] 2/9 03/10/2016   Decreased Interest 0   Down, Depressed, Hopeless 0   PHQ - 2 Score 0   Altered sleeping 0   Tired, decreased energy 0   Change in appetite 1   Feeling bad or failure about yourself  0   Trouble concentrating 0   Moving slowly or fidgety/restless 0   Suicidal thoughts 0   PHQ-9 Score 1   Difficult doing work/chores Not difficult at all      Psychosocial Evaluation and Intervention:     Psychosocial Evaluation - 03/10/16 1232      Psychosocial Evaluation & Interventions   Interventions Encouraged to exercise with the program and follow exercise prescription   Continued Psychosocial Services Needed No      Psychosocial Re-Evaluation:     Psychosocial Re-Evaluation    [place] Name 03/28/16 1348 04/18/16 1635 05/26/16 1135 06/25/16 1407       Psychosocial Re-Evaluation   Interventions Encouraged to attend Cardiac Rehabilitation for the exercise Encouraged to attend Cardiac Rehabilitation for the exercise Relaxation education;Encouraged to attend Cardiac Rehabilitation for the exercise;Stress management education  -    Comments  -  -  - Patient is not depressed. He does not need counseling.     Continued Psychosocial Services Needed No No No No       Vocational Rehabilitation: Provide vocational rehab assistance to qualifying candidates.   Vocational Rehab Evaluation & Intervention:     Vocational Rehab - 03/10/16 1220      Initial Vocational Rehab Evaluation & Intervention   Assessment shows need for Vocational Rehabilitation No      Education: Education Goals: Education classes will be  provided on a weekly basis, covering required topics. Participant will state understanding/return demonstration of topics presented.  Learning Barriers/Preferences:     Learning Barriers/Preferences - 03/10/16 1220      Learning Barriers/Preferences   Learning Barriers None   Learning Preferences Skilled Demonstration  Doing      Education Topics: Hypertension, Hypertension Reduction -Define heart disease and high blood pressure. Discus how high blood pressure affects the body and ways to reduce high blood pressure. Flowsheet Row CARDIAC REHAB PHASE II EXERCISE from 06/18/2016 in [place]  Date  04/02/16  Educator  Russella Dar  Instruction Review Code  2- meets goals/outcomes      Exercise and Your Heart -Discuss why it is important to exercise, the FITT principles of exercise, normal and abnormal responses to exercise, and how to exercise safely. Flowsheet Row CARDIAC REHAB PHASE II EXERCISE from 06/18/2016 in [place]  Date  04/09/16  Educator  DC  Instruction Review Code  2- meets goals/outcomes      Angina -Discuss definition of angina, causes of angina, treatment of angina, and how to decrease risk of having angina.   Cardiac Medications -Review what the following cardiac medications are used for, how they affect the  body, and side effects that may occur when taking the medications.  Medications include Aspirin, Beta blockers, calcium channel blockers, ACE Inhibitors, angiotensin receptor blockers, diuretics, digoxin, and antihyperlipidemics. Flowsheet Row CARDIAC REHAB PHASE II EXERCISE from 06/18/2016 in [place]  Date  04/23/16  Educator  D. Tywana Robotham  Instruction Review Code  2- meets goals/outcomes      Congestive Heart Failure -Discuss the definition of CHF, how to live with CHF, the signs and symptoms of CHF, and how keep track of weight and sodium intake. Flowsheet Row CARDIAC REHAB PHASE II  EXERCISE from 06/18/2016 in [place]  Date  04/30/16  Educator  DC  Instruction Review Code  2- meets goals/outcomes      Heart Disease and Intimacy -Discus the effect sexual activity has on the heart, how changes occur during intimacy as we age, and safety during sexual activity. Flowsheet Row CARDIAC REHAB PHASE II EXERCISE from 06/18/2016 in [place]  Date  05/07/16  Educator  DC  Instruction Review Code  2- meets goals/outcomes      Smoking Cessation / COPD -Discuss different methods to quit smoking, the health benefits of quitting smoking, and the definition of COPD. Flowsheet Row CARDIAC REHAB PHASE II EXERCISE from 06/18/2016 in [place]  Date  05/14/16  Educator  Russella Dar  Instruction Review Code  2- meets goals/outcomes      Nutrition I: Fats -Discuss the types of cholesterol, what cholesterol does to the heart, and how cholesterol levels can be controlled.   Nutrition II: Labels -Discuss the different components of food labels and how to read food label [place] from 06/18/2016 in [place]  Date  05/28/16  Educator  Russella Dar  Instruction Review Code  2- meets goals/outcomes      Heart Parts and Heart Disease -Discuss the anatomy of the heart, the pathway of blood circulation through the heart, and these are affected by heart disease.   Stress I: Signs and Symptoms -Discuss the causes of stress, how stress may lead to anxiety and depression, and ways to limit stress.   Stress II: Relaxation -Discuss different types of relaxation techniques to limit stress. Flowsheet Row CARDIAC REHAB PHASE II EXERCISE from 06/18/2016 in [place]  Date  06/18/16  Educator  DJ/DC  Instruction Review Code  2- meets goals/outcomes      Warning Signs of Stroke / TIA -Discuss definition of a stroke, what the signs  and symptoms are of a stroke, and how to identify when someone is having stroke. Flowsheet Row CARDIAC REHAB PHASE II EXERCISE from 06/18/2016 in [place]  Date  03/26/16  Educator  Russella Dar  Instruction Review Code  2- meets goals/outcomes      Knowledge Questionnaire Score:     Knowledge Questionnaire Score - 03/10/16 1220      Knowledge Questionnaire Score   Pre Score 20/24      Core Components/Risk Factors/Patient Goals at Admission:     Personal Goals and Risk Factors at Admission - 03/10/16 1228      Core Components/Risk Factors/Patient Goals on Admission   Personal Goal Other Yes   Personal Goal Increase activity level   Intervention Be able to get back to hiking at least 5 miles   Expected Outcomes Lose about 10lbs before graduation      Core Components/Risk Factors/Patient Goals Review:  Goals and Risk Factor Review    Row Name 03/28/16 1345 04/18/16 1634 05/26/16 1134 06/25/16 1406       Core Components/Risk Factors/Patient Goals Review   Personal Goals Review Weight Management/Obesity;Lipids;Other  back to hiking, increase activity Weight Management/Obesity;Lipids;Other Weight Management/Obesity;Lipids;Other  -    Review No progress due to only 3 visits into the program at this time. Will continue progress patient on machines and educate Will continue progress patient on machines and educate Will continue progress patient on machines and educate. pt states he feels a lot better and feels stronger. pt has lost 9 lbs since first starting program . Will continue progress patient on machines and educate. pt states he feels a lot better and feels stronger. pt has lost 10.5 since first starting program .    Expected Outcomes lose weight, increase activity lose weight, increase activity lose weight, increase activity Conltinue to gain strength and increase activity       Core Components/Risk Factors/Patient Goals at Discharge (Final  Review):      Goals and Risk Factor Review - 06/25/16 1406      Core Components/Risk Factors/Patient Goals Review   Review Will continue progress patient on machines and educate. pt states he feels a lot better and feels stronger. pt has lost 10.5 since first starting program .   Expected Outcomes Conltinue to gain strength and increase activity      ITP Comments:   Comments: Patient has progressed well through the program. He will graduate today 06/25/16. 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

## 2016-06-26 NOTE — Progress Notes (Signed)
Discharge Summary  Patient Details  Name: Paul Sullivan MRN: [id number] Date of Birth: 1970/12/25 Referring Provider:     Number of Visits: 36  Reason for Discharge:  Patient reached a stable level of exercise. Patient independent in their exercise.  Smoking History:  History  Smoking Status  . Never Smoker  Smokeless Tobacco  . 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 (HCC)  Stented coronary artery  ADL UCSD:   Initial Exercise Prescription:     Initial Exercise Prescription - 03/10/16 1044      Date of Initial Exercise RX and Referring Provider   Date 03/10/16     Treadmill   MPH 2   Grade 0   Minutes 15   METs 2.53     Elliptical   Level 1   Minutes 15   METs 1.5     Prescription Details   Frequency (times per week) 3   Duration Progress to 30 minutes of continuous aerobic without signs/symptoms of physical distress     Intensity   THRR REST +  30   THRR 40-80% of Max Heartrate 114-135   Ratings of Perceived Exertion 11-13   Perceived Dyspnea 0-4     Progression   Progression Continue to progress workloads to maintain intensity without signs/symptoms of physical distress.     Resistance Training   Training Prescription Yes   Weight 1   Reps 8-10      Discharge Exercise Prescription (Final Exercise Prescription Changes):     Exercise Prescription Changes - 06/18/16 1400      Exercise Review   Progression Yes     Response to Exercise   Blood Pressure (Admit) 100/62   Blood Pressure (Exercise) 120/62   Blood Pressure (Exit) 106/62   Heart Rate (Admit) 70 bpm   Heart Rate (Exercise) 117 bpm   Heart Rate (Exit) 84 bpm   Rating of Perceived Exertion (Exercise) 12   Symptoms None   Duration Progress to 30 minutes of continuous aerobic without signs/symptoms of physical distress   Intensity Rest + 30     Progression   Progression Continue to  progress workloads to maintain intensity without signs/symptoms of physical distress.     Resistance Training   Training Prescription Yes   Weight 8.0   Reps 10-12     Interval Training   Interval Training No     Treadmill   MPH 3.5   Grade 1.5   Minutes 20   METs 4.29     NuStep   Level 3   Watts 128   Minutes 15   METs 6.5     Elliptical   Level 1   Minutes 15   METs 3.1     Home Exercise Plan   Plans to continue exercise at Home   Frequency Add 2 additional days to program exercise sessions.      Functional Capacity:     6 Minute Walk    Row Name 03/11/16 1401 06/26/16 0810       6 Minute Walk   Phase Initial Discharge    Distance 1600 feet 2000 feet    Distance % Change  - 25 %    Walk Time 8 minutes 6 minutes    # of Rest Breaks 1 0    MPH 3.03 3.78    METS 3.32 3.89    RPE 12 11    Perceived  Dyspnea  9 7    VO2 Peak 17.22 20    Symptoms No No    Resting HR 73 bpm 80 bpm    Resting BP 98/68 94/60    Max Ex. HR 97 bpm 117 bpm    Max Ex. BP 120/70 100/74    2 Minute Post BP 90/64 90/68       Psychological, QOL, Others - Outcomes: PHQ 2/9: Depression screen [facility] 2/9 06/26/2016 03/10/2016  Decreased Interest 0 0  Down, Depressed, Hopeless 0 0  PHQ - 2 Score 0 0  Altered sleeping 0 0  Tired, decreased energy 0 0  Change in appetite 0 1  Feeling bad or failure about yourself  0 0  Trouble concentrating 0 0  Moving slowly or fidgety/restless 0 0  Suicidal thoughts 0 0  PHQ-9 Score 0 1  Difficult doing work/chores Not difficult at all Not difficult at all    Quality of Life:     Quality of Life - 06/26/16 0818      Quality of Life Scores   Health/Function Pre 26.8 %   Health/Function Post 23.77 %   Health/Function % Change -11.31 %   Socioeconomic Pre 29.25 %   Socioeconomic Post 22.5 %   Socioeconomic % Change  -23.08 %   Psych/Spiritual Pre 30 %   Psych/Spiritual Post 25.36 %   Psych/Spiritual % Change -15.47 %   Family Pre 27.6  %   Family Post 22.5 %   Family % Change -18.48 %   GLOBAL Pre 28.11 %   GLOBAL Post 23.86 %   GLOBAL % Change -15.12 %      Personal Goals: Goals established at orientation with interventions provided to work toward goal.     Personal Goals and Risk Factors at Admission - 03/10/16 1228      Core Components/Risk Factors/Patient Goals on Admission   Personal Goal Other Yes   Personal Goal Increase activity level   Intervention Be able to get back to hiking at least 5 miles   Expected Outcomes Lose about 10lbs before graduation       Personal Goals Discharge:     Goals and Risk Factor Review    Row Name 03/28/16 1345 04/18/16 1634 05/26/16 1134 06/25/16 1406 06/26/16 1549     Core Components/Risk Factors/Patient Goals Review   Personal Goals Review Weight Management/Obesity;Lipids;Other  back to hiking, increase activity Weight Management/Obesity;Lipids;Other Weight Management/Obesity;Lipids;Other  - Weight Management/Obesity;Increase Strength and Stamina;Other  Get back to hiking.    Review No progress due to only 3 visits into the program at this time. Will continue progress patient on machines and educate Will continue progress patient on machines and educate Will continue progress patient on machines and educate. pt states he feels a lot better and feels stronger. pt has lost 9 lbs since first starting program . Will continue progress patient on machines and educate. pt states he feels a lot better and feels stronger. pt has lost 10.5 since first starting program . Upon graduation, patient achieved his weight loss goal losing 10.3 lbs. His strength and stamina did increase and he is hiking again.    Expected Outcomes lose weight, increase activity lose weight, increase activity lose weight, increase activity Conltinue to gain strength and increase activity  -      Nutrition & Weight - Outcomes:     Pre Biometrics - 03/11/16 1732      Pre Biometrics   Waist Circumference  39 inches  Hip Circumference 39 inches   Waist to Hip Ratio 1 %   Triceps Skinfold 6 mm   % Body Fat 21.6 %   Grip Strength 83 kg   Flexibility 0 in  tried unable to reach toes   Single Leg Stand 61 seconds         Post Biometrics - 06/26/16 [id number]       Post  Biometrics   Height 6\' 2"  (1.88 m)   Weight 195 lb 1.7 oz (88.5 kg)   Waist Circumference 37.5 inches   Hip Circumference 38 inches   Waist to Hip Ratio 0.99 %   BMI (Calculated) 25.1   Triceps Skinfold 5 mm   % Body Fat 19.7 %   Grip Strength 95 kg   Flexibility 0 in   Single Leg Stand 63 seconds      Nutrition:     Nutrition Therapy & Goals - 03/10/16 1221      Personal Nutrition Goals   Personal Goal #1 Continue to eat a Paleo Diet/ Mediiterranean Diet     Intervention Plan   Expected Outcomes Short Term Goal: Understand basic principles of dietary content, such as calories, fat, sodium, cholesterol and nutrients.  He is satisfied with the Paleo/Metiterranean diet that he has set for himself. Plans on sticking with that at this time.       Nutrition Discharge:     Nutrition Assessments - 06/26/16 1542      MEDFICTS Scores   Pre Score 13   Post Score 34   Score Difference 21      Education Questionnaire Score:     Knowledge Questionnaire Score - 06/26/16 1542      Knowledge Questionnaire Score   Pre Score 20/24   Post Score 23/24      Goals reviewed with patient; copy given to patient.

## 2016-06-27 ENCOUNTER — Encounter (HOSPITAL_COMMUNITY): Payer: BC Managed Care – PPO

## 2016-10-08 ENCOUNTER — Ambulatory Visit: Payer: BC Managed Care – PPO | Admitting: Nurse Practitioner

## 2016-10-15 ENCOUNTER — Ambulatory Visit (INDEPENDENT_AMBULATORY_CARE_PROVIDER_SITE_OTHER): Payer: BC Managed Care – PPO | Admitting: Nurse Practitioner

## 2016-10-15 ENCOUNTER — Encounter (INDEPENDENT_AMBULATORY_CARE_PROVIDER_SITE_OTHER): Payer: Self-pay

## 2016-10-15 ENCOUNTER — Encounter: Payer: Self-pay | Admitting: Nurse Practitioner

## 2016-10-15 VITALS — BP 118/80 | HR 71 | Ht 74.0 in | Wt 190.8 lb

## 2016-10-15 DIAGNOSIS — E785 Hyperlipidemia, unspecified: Secondary | ICD-10-CM

## 2016-10-15 DIAGNOSIS — R7309 Other abnormal glucose: Secondary | ICD-10-CM | POA: Diagnosis not present

## 2016-10-15 DIAGNOSIS — I2510 Atherosclerotic heart disease of native coronary artery without angina pectoris: Secondary | ICD-10-CM | POA: Diagnosis not present

## 2016-10-15 LAB — CBC
HCT: 47.7 % (ref 38.5–50.0)
Hemoglobin: 16.1 g/dL (ref 13.2–17.1)
MCH: 29.7 pg (ref 27.0–33.0)
MCHC: 33.8 g/dL (ref 32.0–36.0)
MCV: 88 fL (ref 80.0–100.0)
MPV: 10.2 fL (ref 7.5–12.5)
Platelets: 213 10*3/uL (ref 140–400)
RBC: 5.42 MIL/uL (ref 4.20–5.80)
RDW: 13.1 % (ref 11.0–15.0)
WBC: 6.5 10*3/uL (ref 3.8–10.8)

## 2016-10-15 LAB — BASIC METABOLIC PANEL
BUN: 10 mg/dL (ref 7–25)
CO2: 29 mmol/L (ref 20–31)
Calcium: 9.3 mg/dL (ref 8.6–10.3)
Chloride: 99 mmol/L (ref 98–110)
Creat: 0.89 mg/dL (ref 0.60–1.35)
Glucose, Bld: 112 mg/dL — ABNORMAL HIGH (ref 65–99)
Potassium: 4.2 mmol/L (ref 3.5–5.3)
Sodium: 135 mmol/L (ref 135–146)

## 2016-10-15 LAB — HEPATIC FUNCTION PANEL
ALT: 31 U/L (ref 9–46)
AST: 22 U/L (ref 10–40)
Albumin: 4.3 g/dL (ref 3.6–5.1)
Alkaline Phosphatase: 58 U/L (ref 40–115)
Bilirubin, Direct: 0.2 mg/dL (ref <=0.2)
Indirect Bilirubin: 0.6 mg/dL (ref 0.2–1.2)
Total Bilirubin: 0.8 mg/dL (ref 0.2–1.2)
Total Protein: 7.1 g/dL (ref 6.1–8.1)

## 2016-10-15 LAB — LIPID PANEL
Cholesterol: 115 mg/dL (ref <200)
HDL: 33 mg/dL — ABNORMAL LOW (ref >40)
LDL Cholesterol: 63 mg/dL (ref <100)
Total CHOL/HDL Ratio: 3.5 Ratio (ref <5.0)
Triglycerides: 97 mg/dL (ref <150)
VLDL: 19 mg/dL (ref <30)

## 2016-10-15 MED ORDER — TICAGRELOR 90 MG PO TABS: 90.0000 mg | ORAL_TABLET | Freq: Two times a day (BID) | ORAL | 3 refills | Status: DC

## 2016-10-15 NOTE — Patient Instructions (Addendum)
We will be checking the following labs today - BMET, CBC, HPF, Lipids and A1C   Medication Instructions:    Continue with your current medicines. BUT  I am stopping the Ramipril     Testing/Procedures To Be Arranged:  N/A  Follow-Up:   See Dr. Tamala Julian in 6 months    Other Special Instructions:   Keep up the good work!    If you need a refill on your cardiac medications before your next appointment, please call your pharmacy.   Call the [place] office at [phone] if you have any questions, problems or concerns.

## 2016-10-15 NOTE — Progress Notes (Signed)
CARDIOLOGY OFFICE NOTE  Date:  10/15/2016    Paul Sullivan Date of Birth: 02/12/1971 Medical Record [id number]  PCP:  Chesley Noon, MD  Cardiologist:  Tamala Julian    Chief Complaint  Patient presents with  . Coronary Artery Disease    Follow up visit - seen for Dr. Tamala Julian    History of Present Illness: Paul Sullivan is a 45 y.o. male who presents today for a 7 month check. Seen for Dr. Tamala Julian.   He has a history of CAD with prior inferior infarct, right coronary DES stenting, residual 60-80% mid LAD with negative Cardiolite in March of 2017. His other issues include HLD.  He was seen back in April and felt to be doing well.  Comes in today. Here alone. Doing well. Actively losing weight. Exercising at least 4 times a week. No chest pain. Breathing is good. Little lightheaded at times. BP low normal. No syncope. Asking about staying on Ramipril. Asking about getting a tattoo - he does bruise "alot".    Past Medical History:  Diagnosis Date  . CAD (coronary artery disease)    a. Inf STEMI 3/17 - LHC pLAD 80, dLAD 50/60, oLCx 30, mRCA 100, 50, Normal LVF >> PCI - DES to RCA;  b. OP nuc stress study to assess LAD territory ischemia  . Dyslipidemia, goal LDL below 70 02/07/2016    Past Surgical History:  Procedure Laterality Date  . CARDIAC CATHETERIZATION N/A 02/05/2016   Procedure: Left Heart Cath and Coronary Angiography;  Surgeon: Belva Crome, MD;  Location: [place] [facility];  Service: Cardiovascular;  Laterality: N/A;     Medications: Current Outpatient Prescriptions  Medication Sig Dispense Refill  . aspirin 81 MG chewable tablet Chew 1 tablet (81 mg total) by mouth daily. 30 tablet 11  . atorvastatin (LIPITOR) 80 MG tablet Take 1 tablet (80 mg total) by mouth daily at 6 PM. 30 tablet 6  . metoprolol tartrate (LOPRESSOR) 25 MG tablet Take 1 tablet (25 mg total) by mouth 2 (two) times daily. 60 tablet 11  . nitroGLYCERIN (NITROSTAT) 0.4 MG SL  tablet Place 1 tablet (0.4 mg total) under the tongue every 5 (five) minutes x 3 doses as needed for chest pain. 25 tablet 12  . ticagrelor (BRILINTA) 90 MG TABS tablet Take 1 tablet (90 mg total) by mouth 2 (two) times daily. 180 tablet 3   No current facility-administered medications for this visit.     Allergies: No Known Allergies  Social History: The patient  reports that he has never smoked. He has never used smokeless tobacco. He reports that he does not drink alcohol or use drugs.   Family History: The patient's family history includes Coronary artery disease in his mother; Heart failure in his maternal uncle; Hyperlipidemia in his father and mother; Hypertension in his father and mother.   Review of Systems: Please see the history of present illness.   Otherwise, the review of systems is positive for none.   All other systems are reviewed and negative.   Physical Exam: VS:  BP 118/80   Pulse 71   Ht 6\' 2"  (1.88 m)   Wt 190 lb 12.8 oz (86.5 kg)   SpO2 95% Comment: at rest  BMI 24.50 kg/m  .  BMI Body mass index is 24.5 kg/m.  Wt Readings from Last 3 Encounters:  10/15/16 190 lb 12.8 oz (86.5 kg)  06/26/16 195 lb 1.7 oz (88.5 kg)  03/10/16 205 lb 4.8 oz (93.1 kg)    General: Pleasant. Well developed, well nourished and in no acute distress. Has lost 15 pounds over the past 6 months.   HEENT: Normal.  Neck: Supple, no JVD, carotid bruits, or masses noted.  Cardiac: Regular rate and rhythm. No murmurs, rubs, or gallops. No edema.  Respiratory:  Lungs are clear to auscultation bilaterally with normal work of breathing.  GI: Soft and nontender.  MS: No deformity or atrophy. Gait and ROM intact.  Skin: Warm and dry. Color is normal.  Neuro:  Strength and sensation are intact and no gross focal deficits noted.  Psych: Alert, appropriate and with normal affect.   LABORATORY DATA:  EKG:  EKG is not ordered today.  Lab Results  Component Value Date   WBC 6.9  02/06/2016   HGB 14.4 02/06/2016   HCT 43.4 02/06/2016   PLT 161 02/06/2016   GLUCOSE 102 (H) 02/06/2016   CHOL 161 02/06/2016   TRIG 122 02/06/2016   HDL 25 (L) 02/06/2016   LDLCALC 112 (H) 02/06/2016   ALT 37 02/05/2016   AST 29 02/05/2016   NA 141 02/06/2016   K 3.9 02/06/2016   CL 108 02/06/2016   CREATININE 0.83 02/06/2016   BUN 8 02/06/2016   CO2 24 02/06/2016   TSH 1.253 02/05/2016   INR 1.00 02/05/2016   HGBA1C 5.8 (H) 02/05/2016    BNP (last 3 results) No results for input(s): BNP in the last 8760 hours.  ProBNP (last 3 results) No results for input(s): PROBNP in the last 8760 hours.   Other Studies Reviewed Today:  Myoview Study Highlights 01/2016    Nuclear stress EF: 54%.  There was no ST segment deviation noted during stress.  Defect 1: There is a medium defect of moderate severity present in the basal inferior, basal inferolateral, mid inferior and mid inferolateral location.  Findings consistent with ischemia.  This is an intermediate risk study.  The left ventricular ejection fraction is normal (55-65%).   There is medium size, moderate severity reversible defect in the basal and mid inferior and inferolateral walls (SDS = 5).   Notes Recorded by Belva Crome, MD on 02/21/2016 at 1:44 PM Nuclear study does not demonstrate evidence of anterior ischemia. The inferior defect is where the patient had the most recent infarct and is not a surprising finding. There is no significant ischemia in that region. We will continue conservative medical management. We will discuss with the patient on return  Procedures   Left Heart Cath and Coronary Angiography 01/2016  Conclusion   1. Ost Cx lesion, 30% stenosed. 2. Prox LAD to Mid LAD lesion, 80% stenosed. 3. Dist LAD-1 lesion, 50% stenosed. 4. Dist LAD-2 lesion, 60% stenosed. 5. Mid RCA to Dist RCA lesion, 50% stenosed. 6. There is mild left ventricular systolic dysfunction. 7. Mid RCA lesion, 100%  stenosed. Post intervention, there is a 0% residual stenosis.    Acute inferior ST elevation myocardial infarction due to thrombotic occlusion of the mid RCA secondary to plaque rupture.  Successful angioplasty and DES implantation in the mid RCA reducing 100% stenosis to 0% with TIMI grade 3 flow. Final post dilataion diameter 4.5 mm.   Moderately severe proximal to mid LAD bifurcation lesion, [place] 1, 0, 1. Two diagonal side branches are present. Both are small. The mid and distal LAD contains a 60% sequential stenoses.  Widely patent circumflex.  Overall normal LV function with normal LVEDP  RECOMMENDATIONS:  Aspirin and Brilinta.  Consider staged LAD PCI prior to discharge or in several weeks as an elective case with me. If staged will need to get one of my colleagues to perform. We'll make a final decision in a.m.  Low-dose beta blocker therapy  Aggressive statin therapy  Phase II cardiac rehabilitation    Assessment/Plan: 1. Coronary artery disease involving native coronary artery of native heart without angina pectoris - he is doing well - no angina - no LAD ischemia on nuclear - continue with DAPT and CV risk factor modification. I am stopping his Ramipril today. EF normal by Myoview.  2. Dyslipidemia, goal LDL below 70 - lab today.   3. Borderline DM - rechecking A1 C today.  4. Lightheadedness - most likely from his ACE - stopping today.    Current medicines are reviewed with the patient today.  The patient does not have concerns regarding medicines other than what has been noted above.  The following changes have been made:  See above.  Labs/ tests ordered today include:    Orders Placed This Encounter  Procedures  . Basic metabolic panel  . CBC  . Hepatic function panel  . Lipid panel  . Hemoglobin A1c     Disposition:   FU with Dr. Tamala Julian in 6 months.   Patient is agreeable to this plan and will call if any problems develop in the interim.    Signed: Burtis Junes, RN, ANP-C 10/15/2016 8:34 AM  [facility] [address] [place], [place]  [address] Phone: [phone] Fax: [phone]

## 2016-10-16 LAB — HEMOGLOBIN A1C
Hgb A1c MFr Bld: 5.5 % (ref <5.7)
Mean Plasma Glucose: 111 mg/dL

## 2016-10-17 ENCOUNTER — Telehealth: Payer: Self-pay | Admitting: Nurse Practitioner

## 2016-10-17 NOTE — Telephone Encounter (Signed)
Did you call?

## 2016-10-17 NOTE — Telephone Encounter (Signed)
New message  Pt is returning call to Ger[facility]'s office

## 2016-11-16 ENCOUNTER — Other Ambulatory Visit: Payer: Self-pay | Admitting: Physician Assistant

## 2017-04-12 NOTE — Progress Notes (Deleted)
Cardiology Office Note    Date:  04/12/2017   ID:  Paul Sullivan, DOB [birth year], MRN [id number]  PCP:  Chesley Noon, MD  Cardiologist: Sinclair Grooms, MD   No chief complaint on file.   History of Present Illness:  Paul Sullivan is a 46 y.o. male a history of CAD with prior inferior infarct, right coronary DES stenting 01/2016, residual 60-80% mid LAD (negative Cardiolite in March of 2017}. His other issues include HLD.    Past Medical History:  Diagnosis Date  . CAD (coronary artery disease)    a. Inf STEMI 3/17 - LHC pLAD 80, dLAD 50/60, oLCx 30, mRCA 100, 50, Normal LVF >> PCI - DES to RCA;  b. OP nuc stress study to assess LAD territory ischemia  . Dyslipidemia, goal LDL below 70 02/07/2016    Past Surgical History:  Procedure Laterality Date  . CARDIAC CATHETERIZATION N/A 02/05/2016   Procedure: Left Heart Cath and Coronary Angiography;  Surgeon: Belva Crome, MD;  Location: [facility] [facility];  Service: Cardiovascular;  Laterality: N/A;    Current Medications: Outpatient Medications Prior to Visit  Medication Sig Dispense Refill  . aspirin 81 MG chewable tablet Chew 1 tablet (81 mg total) by mouth daily. 30 tablet 11  . atorvastatin (LIPITOR) 80 MG tablet TAKE 1 TABLET(80 MG) BY MOUTH DAILY AT 6 PM 30 tablet 11  . metoprolol tartrate (LOPRESSOR) 25 MG tablet Take 1 tablet (25 mg total) by mouth 2 (two) times daily. 60 tablet 11  . nitroGLYCERIN (NITROSTAT) 0.4 MG SL tablet Place 1 tablet (0.4 mg total) under the tongue every 5 (five) minutes x 3 doses as needed for chest pain. 25 tablet 12  . ticagrelor (BRILINTA) 90 MG TABS tablet Take 1 tablet (90 mg total) by mouth 2 (two) times daily. 180 tablet 3   No facility-administered medications prior to visit.      Allergies:   Patient has no known allergies.   Social History   Social History  . Marital status: Married    Spouse name: N/A  . Number of children: N/A  . Years of education:  N/A   Occupational History  . [employer]    Social History Main Topics  . Smoking status: Never Smoker  . Smokeless tobacco: Never Used  . Alcohol use No  . Drug use: No  . Sexual activity: Not on file   Other Topics Concern  . Not on file   Social History Narrative   Lives in [place] with family     Family History:  The patient's ***family history includes Coronary artery disease in his mother; Heart failure in his maternal uncle; Hyperlipidemia in his father and mother; Hypertension in his father and mother.   ROS:   Please see the history of present illness.    ***  All other systems reviewed and are negative.   PHYSICAL EXAM:   VS:  There were no vitals taken for this visit.   GEN: Well nourished, well developed, in no acute distress  HEENT: normal  Neck: no JVD, carotid bruits, or masses Cardiac: ***RRR; no murmurs, rubs, or gallops,no edema  Respiratory:  clear to auscultation bilaterally, normal work of breathing GI: soft, nontender, nondistended, + BS MS: no deformity or atrophy  Skin: warm and dry, no rash Neuro:  Alert and Oriented x 3, Strength and sensation are intact Psych: euthymic mood, full affect  Wt Readings from Last  3 Encounters:  10/15/16 190 lb 12.8 oz (86.5 kg)  06/26/16 195 lb 1.7 oz (88.5 kg)  03/10/16 205 lb 4.8 oz (93.1 kg)      Studies/Labs Reviewed:   EKG:  EKG  ***  Recent Labs: 10/15/2016: ALT 31; BUN 10; Creat 0.89; Hemoglobin 16.1; Platelets 213; Potassium 4.2; Sodium 135   Lipid Panel    Component Value Date/Time   CHOL 115 10/15/2016 0836   TRIG 97 10/15/2016 0836   HDL 33 (L) 10/15/2016 0836   CHOLHDL 3.5 10/15/2016 0836   VLDL 19 10/15/2016 0836   LDLCALC 63 10/15/2016 0836    Additional studies/ records that were reviewed today include:  ***    ASSESSMENT:    1. Coronary artery disease involving native coronary artery of native heart with angina pectoris ([place])   2. Dyslipidemia,  goal LDL below 70   3. MI, old prior inferior STEMI      PLAN:  In order of problems listed above:  1. ***    Medication Adjustments/Labs and Tests Ordered: Current medicines are reviewed at length with the patient today.  Concerns regarding medicines are outlined above.  Medication changes, Labs and Tests ordered today are listed in the Patient Instructions below. There are no Patient Instructions on file for this visit.   Signed, Sinclair Grooms, MD  04/12/2017 11:12 AM    [facility] [place], [place], [place]  [address] Phone: [phone]; Fax: [phone]

## 2017-04-13 ENCOUNTER — Ambulatory Visit: Payer: BC Managed Care – PPO | Admitting: Interventional Cardiology

## 2017-05-22 ENCOUNTER — Other Ambulatory Visit: Payer: Self-pay | Admitting: Physician Assistant

## 2017-05-31 NOTE — Progress Notes (Signed)
Cardiology Office Note    Date:  06/01/2017   ID:  Paul Sullivan, DOB 06-Dec-1970, MRN [id number]  PCP:  Chesley Noon, MD  Cardiologist: Sinclair Grooms, MD   Chief Complaint  Patient presents with  . Coronary Artery Disease    History of Present Illness:  Paul Sullivan is a 46 y.o. male has a history of CAD with prior inferior infarct, right coronary DES, residual 60-80% mid LAD with negative Cardiolite in March of 2017. His other issues include low HLD.  He has lost nearly 60 pounds since I last saw him. He is doing aerobic exercise, vigorously without limitations. He is a [employer] and has no difficulties hiking. Denies orthopnea, PND, and exertional related chest discomfort. No medication side effects.  Past Medical History:  Diagnosis Date  . CAD (coronary artery disease)    a. Inf STEMI 3/17 - LHC pLAD 80, dLAD 50/60, oLCx 30, mRCA 100, 50, Normal LVF >> PCI - DES to RCA;  b. OP nuc stress study to assess LAD territory ischemia  . Dyslipidemia, goal LDL below 70 02/07/2016    Past Surgical History:  Procedure Laterality Date  . CARDIAC CATHETERIZATION N/A 02/05/2016   Procedure: Left Heart Cath and Coronary Angiography;  Surgeon: Belva Crome, MD;  Location: [facility];  Service: Cardiovascular;  Laterality: N/A;    Current Medications: Outpatient Medications Prior to Visit  Medication Sig Dispense Refill  . aspirin 81 MG chewable tablet Chew 1 tablet (81 mg total) by mouth daily. 30 tablet 11  . atorvastatin (LIPITOR) 80 MG tablet TAKE 1 TABLET(80 MG) BY MOUTH DAILY AT 6 PM 30 tablet 11  . nitroGLYCERIN (NITROSTAT) 0.4 MG SL tablet PLACE 1 TABLET UNDSER THE TONGUE EVERY 5 MINUTES X 3 DOSES AS NEEDED FOR CHEST PAIN 25 tablet 0  . ticagrelor (BRILINTA) 90 MG TABS tablet Take 1 tablet (90 mg total) by mouth 2 (two) times daily. 180 tablet 3  . metoprolol tartrate (LOPRESSOR) 25 MG tablet TAKE 1 TABLET(25 MG) BY MOUTH TWICE DAILY 60 tablet  0   No facility-administered medications prior to visit.      Allergies:   Patient has no known allergies.   Social History   Social History  . Marital status: Married    Spouse name: N/A  . Number of children: N/A  . Years of education: N/A   Occupational History  . [employer]    Social History Main Topics  . Smoking status: Never Smoker  . Smokeless tobacco: Never Used  . Alcohol use No  . Drug use: No  . Sexual activity: Not Asked   Other Topics Concern  . None   Social History Narrative   Lives in [place] with family     Family History:  The patient's family history includes Coronary artery disease in his mother; Heart failure in his maternal uncle; Hyperlipidemia in his father and mother; Hypertension in his father and mother.   ROS:   Please see the history of present illness.    Stable without cardiopulmonary symptoms. Notices easy bruising. Feels lightheaded or dizzy when he stands frequently. This is especially a problem after exercising. All other systems reviewed and are negative.   PHYSICAL EXAM:   VS:  BP 102/68 (BP Location: Left Arm)   Pulse 64   Ht 6\' 2"  (1.88 m)   Wt 190 lb (86.2 kg)   BMI 24.39 kg/m  GEN: Well nourished, well developed, in no acute distress  HEENT: normal  Neck: no JVD, carotid bruits, or masses Cardiac: RRR; no murmurs, rubs, or gallops,no edema  Respiratory:  clear to auscultation bilaterally, normal work of breathing GI: soft, nontender, nondistended, + BS MS: no deformity or atrophy  Skin: warm and dry, no rash Neuro:  Alert and Oriented x 3, Strength and sensation are intact Psych: euthymic mood, full affect  Wt Readings from Last 3 Encounters:  06/01/17 190 lb (86.2 kg)  10/15/16 190 lb 12.8 oz (86.5 kg)  06/26/16 195 lb 1.7 oz (88.5 kg)      Studies/Labs Reviewed:   EKG:  EKG  Sinus rhythm at 64 bpm. Normal EKG.  Recent Labs: 10/15/2016: ALT 31; BUN 10; Creat 0.89; Hemoglobin  16.1; Platelets 213; Potassium 4.2; Sodium 135   Lipid Panel    Component Value Date/Time   CHOL 115 10/15/2016 0836   TRIG 97 10/15/2016 0836   HDL 33 (L) 10/15/2016 0836   CHOLHDL 3.5 10/15/2016 0836   VLDL 19 10/15/2016 0836   LDLCALC 63 10/15/2016 0836    Additional studies/ records that were reviewed today include:  Cath and PCI 01/2016 Coronary Diagrams   Diagnostic Diagram       Post-Intervention Diagram        Stress myocardial perfusion imaging 02/19/2016: Study Highlights    Nuclear stress EF: 54%.  There was no ST segment deviation noted during stress.  Defect 1: There is a medium defect of moderate severity present in the basal inferior, basal inferolateral, mid inferior and mid inferolateral location.  Findings consistent with ischemia.  This is an intermediate risk study.  The left ventricular ejection fraction is normal (55-65%).   There is medium size, moderate severity reversible defect in the basal and mid inferior and inferolateral walls (SDS = 5).     ASSESSMENT:    1. Coronary artery disease involving native coronary artery of native heart with angina pectoris ([place])   2. 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 Dyslipidemia, goal LDL below 70      PLAN:  In order of problems listed above:  1. CAD with moderate LAD disease but no evidence of ischemia by stress nuclear study as identified above. He is asymptomatic with reference to angina. He is now one year out from coronary stent procedure for ACS. Plan to discontinue Brilinta. Continue aspirin daily. Decrease metoprolol metoprolol succinate 25 mg once per day. 2. Normal LV function with EF greater than 50% by nuclear testing. 3. LDL target less than 70. Last one performed with an November when the LDL was 63. He has lost significant weight since that time so we may be able to further reduce statin intensity. Target is less than 70.  Plan discontinue Brilinta, lipid panel, consider further  reduction in statin intensity. Clinical follow-up in 9 months.  Medication Adjustments/Labs and Tests Ordered: Current medicines are reviewed at length with the patient today.  Concerns regarding medicines are outlined above.  Medication changes, Labs and Tests ordered today are listed in the Patient Instructions below. There are no Patient Instructions on file for this visit.   Signed, Sinclair Grooms, MD  06/01/2017 3:52 PM    [facility] [address], [place], [place]  [address] Phone: [phone]; Fax: [phone]

## 2017-06-01 ENCOUNTER — Encounter: Payer: Self-pay | Admitting: Interventional Cardiology

## 2017-06-01 ENCOUNTER — Encounter (INDEPENDENT_AMBULATORY_CARE_PROVIDER_SITE_OTHER): Payer: Self-pay

## 2017-06-01 ENCOUNTER — Ambulatory Visit (INDEPENDENT_AMBULATORY_CARE_PROVIDER_SITE_OTHER): Payer: BC Managed Care – PPO | Admitting: Interventional Cardiology

## 2017-06-01 VITALS — BP 102/68 | HR 64 | Ht 74.0 in | Wt 190.0 lb

## 2017-06-01 DIAGNOSIS — E785 Hyperlipidemia, unspecified: Secondary | ICD-10-CM | POA: Diagnosis not present

## 2017-06-01 DIAGNOSIS — I25119 Atherosclerotic heart disease of native coronary artery with unspecified angina pectoris: Secondary | ICD-10-CM | POA: Diagnosis not present

## 2017-06-01 DIAGNOSIS — I252 Old myocardial infarction: Secondary | ICD-10-CM

## 2017-06-01 MED ORDER — METOPROLOL SUCCINATE ER 25 MG PO TB24: 25.0000 mg | ORAL_TABLET | Freq: Every day | ORAL | 3 refills | Status: DC

## 2017-06-01 NOTE — Patient Instructions (Signed)
Medication Instructions:  1) STOP Brilinta 2) STOP Metoprolol Tartrate 3) START Metoprolol Succinate 25mg  once daily  Labwork: Your physician recommends that you return for lab work when you are fasting (Lipid, liver)   Testing/Procedures: None  Follow-Up: Your physician wants you to follow-up in: 9 months with Dr. Tamala Julian.  You will receive a reminder letter in the mail two months in advance. If you don't receive a letter, please call our office to schedule the follow-up appointment.   Any Other Special Instructions Will Be Listed Below (If Applicable).     If you need a refill on your cardiac medications before your next appointment, please call your pharmacy.

## 2017-07-31 ENCOUNTER — Other Ambulatory Visit (HOSPITAL_COMMUNITY)
Admission: RE | Admit: 2017-07-31 | Discharge: 2017-07-31 | Disposition: A | Discharge status: Home / Self Care | Payer: BC Managed Care – PPO | Source: Ambulatory Visit | Attending: Interventional Cardiology | Admitting: Interventional Cardiology

## 2017-07-31 DIAGNOSIS — I25119 Atherosclerotic heart disease of native coronary artery with unspecified angina pectoris: Secondary | ICD-10-CM | POA: Diagnosis not present

## 2017-07-31 DIAGNOSIS — E785 Hyperlipidemia, unspecified: Secondary | ICD-10-CM | POA: Diagnosis present

## 2017-07-31 LAB — LIPID PANEL
Cholesterol: 97 mg/dL (ref 0–200)
HDL: 33 mg/dL — ABNORMAL LOW (ref >40)
LDL CALC: 52 mg/dL (ref 0–99)
Total CHOL/HDL Ratio: 2.9 RATIO
Triglycerides: 62 mg/dL (ref <150)
VLDL: 12 mg/dL (ref 0–40)

## 2017-07-31 LAB — HEPATIC FUNCTION PANEL
ALK PHOS: 62 U/L (ref 38–126)
ALT: 35 U/L (ref 17–63)
AST: 27 U/L (ref 15–41)
Albumin: 4.2 g/dL (ref 3.5–5.0)
BILIRUBIN INDIRECT: 0.7 mg/dL (ref 0.3–0.9)
BILIRUBIN TOTAL: 0.9 mg/dL (ref 0.3–1.2)
Bilirubin, Direct: 0.2 mg/dL (ref 0.1–0.5)
Total Protein: 7.1 g/dL (ref 6.5–8.1)

## 2017-08-03 IMAGING — NM NM MISC PROCEDURE
3 series · 18 of 18 positions shown · non-contrast
Comparison: none

[Series 1: stress-sum-em_(id)_sa · 6.4mm · 6.40mm/px · 6 of 64 frames shown]
[frame 6/64]
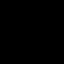
[frame 16/64]
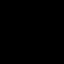
[frame 27/64]
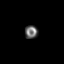
[frame 38/64]
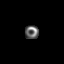
[frame 48/64]
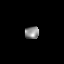
[frame 59/64]
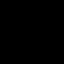

[Series 1: stress-gsp_(id)_sa · 6.4mm · 6.40mm/px · 6 of 512 frames shown]
[frame 43/512]
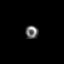
[frame 128/512]
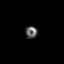
[frame 214/512]
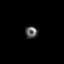
[frame 299/512]
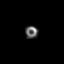
[frame 384/512]
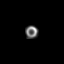
[frame 470/512]
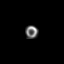

[Series 1: rest_(id)_sa · 6.4mm · 6.40mm/px · 6 of 64 frames shown]
[frame 6/64]
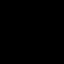
[frame 16/64]
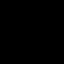
[frame 27/64]
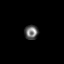
[frame 38/64]
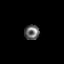
[frame 48/64]
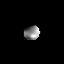
[frame 59/64]
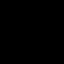

[18 of 18 positions shown; findings below may reference images not displayed]

Canned report from images found in remote index.

Refer to host system for actual result text.

## 2018-01-10 ENCOUNTER — Other Ambulatory Visit: Payer: Self-pay | Admitting: Interventional Cardiology

## 2018-05-08 ENCOUNTER — Other Ambulatory Visit: Payer: Self-pay | Admitting: Interventional Cardiology

## 2018-07-20 ENCOUNTER — Other Ambulatory Visit: Payer: Self-pay | Admitting: Interventional Cardiology

## 2018-08-22 ENCOUNTER — Other Ambulatory Visit: Payer: Self-pay | Admitting: Interventional Cardiology

## 2018-09-06 ENCOUNTER — Other Ambulatory Visit: Payer: Self-pay | Admitting: Interventional Cardiology

## 2018-09-06 MED ORDER — ATORVASTATIN CALCIUM 80 MG PO TABS: 80.0000 mg | ORAL_TABLET | Freq: Every day | ORAL | 0 refills | Status: DC

## 2018-09-06 MED ORDER — METOPROLOL SUCCINATE ER 25 MG PO TB24: 25.0000 mg | ORAL_TABLET | Freq: Every day | ORAL | 0 refills | Status: DC

## 2018-09-06 NOTE — Telephone Encounter (Signed)
Pt's medication was sent to pt's pharmacy as requested. Confirmation received.  °

## 2018-09-06 NOTE — Telephone Encounter (Signed)
New Message           *STAT* If patient is at the pharmacy, call can be transferred to refill team.   1. Which medications need to be refilled? (please list name of each medication and dose if known) Lipitor 80 mg/Metoprolol 25  2. Which pharmacy/location (including street and city if local pharmacy) is medication to be sent to? [facility] [address]  3. Do they need a 30 day or 90 day supply? [place]

## 2018-09-26 NOTE — Progress Notes (Addendum)
Cardiology Office Note:    Date:  09/27/2018   ID:  Paul Sullivan, DOB 25-Dec-1970, MRN [id number]  PCP:  Chesley Noon, MD  Cardiologist:  Sinclair Grooms, MD   Referring MD: Chesley Noon, MD   Chief Complaint  Patient presents with  . Coronary Artery Disease  . Hyperlipidemia    History of Present Illness:    Paul Sullivan is a 47 y.o. male with a hx of CAD with prior inferior infarct, right coronary DES, residual 60-80% mid LAD with negative Cardiolite in March of 2017, and hyperlipidemia.  Paul Sullivan is working without limitations.  He has been compliant with medical therapy for secondary prevention of cardiac events.  He denies medication side effects.  He has no particular complaints.  He states there is significantly more than 150 minutes of moderate aerobic activity per week.  He occasionally has a sensation of tightness in the chest, mild in nature can last up to 20 to 30 seconds.  Usually occurs at rest.  Not exertion related.  Past Medical History:  Diagnosis Date  . CAD (coronary artery disease)    a. Inf STEMI 3/17 - LHC pLAD 80, dLAD 50/60, oLCx 30, mRCA 100, 50, Normal LVF >> PCI - DES to RCA;  b. OP nuc stress study to assess LAD territory ischemia  . Dyslipidemia, goal LDL below 70 02/07/2016    Past Surgical History:  Procedure Laterality Date  . CARDIAC CATHETERIZATION N/A 02/05/2016   Procedure: Left Heart Cath and Coronary Angiography;  Surgeon: Belva Crome, MD;  Location: [facility];  Service: Cardiovascular;  Laterality: N/A;    Current Medications: Current Meds  Medication Sig  . aspirin 81 MG chewable tablet Chew 1 tablet (81 mg total) by mouth daily.  Marland Kitchen atorvastatin (LIPITOR) 80 MG tablet Take 1 tablet (80 mg total) by mouth daily at 6 PM.  . metoprolol succinate (TOPROL-XL) 25 MG 24 hr tablet Take 1 tablet (25 mg total) by mouth daily.  . nitroGLYCERIN (NITROSTAT) 0.4 MG SL tablet Place 1 tablet (0.4 mg total)  under the tongue every 5 (five) minutes x 3 doses as needed for chest pain (Max 3rd dose call 911).  . [DISCONTINUED] atorvastatin (LIPITOR) 80 MG tablet Take 1 tablet (80 mg total) by mouth daily at 6 PM. Please keep upcoming appt with Dr. Tamala Julian in October for future refills. Thank you  . [DISCONTINUED] metoprolol succinate (TOPROL-XL) 25 MG 24 hr tablet Take 1 tablet (25 mg total) by mouth daily. Please keep upcoming appt in October with Dr. Tamala Julian for future refills. Thank you  . [DISCONTINUED] nitroGLYCERIN (NITROSTAT) 0.4 MG SL tablet PLACE 1 TABLET UNDSER THE TONGUE EVERY 5 MINUTES X 3 DOSES AS NEEDED FOR CHEST PAIN     Allergies:   Patient has no known allergies.   Social History   Socioeconomic History  . Marital status: Married    Spouse name: Not on file  . Number of children: Not on file  . Years of education: Not on file  . Highest education level: Not on file  Occupational History  . Occupation: [employer]  Social Needs  . Financial resource strain: Not on file  . Food insecurity:    Worry: Not on file    Inability: Not on file  . Transportation needs:    Medical: Not on file    Non-medical: Not on file  Tobacco Use  . Smoking status: Never Smoker  .  Smokeless tobacco: Never Used  Substance and Sexual Activity  . Alcohol use: No    Alcohol/week: 0.0 standard drinks  . Drug use: No  . Sexual activity: Not on file  Lifestyle  . Physical activity:    Days per week: Not on file    Minutes per session: Not on file  . Stress: Not on file  Relationships  . Social connections:    Talks on phone: Not on file    Gets together: Not on file    Attends religious service: Not on file    Active member of club or organization: Not on file    Attends meetings of clubs or organizations: Not on file    Relationship status: Not on file  Other Topics Concern  . Not on file  Social History Narrative   Lives in [place] with family     Family  History: The patient's family history includes Coronary artery disease in his mother; Heart failure in his maternal uncle; Hyperlipidemia in his father and mother; Hypertension in his father and mother.  ROS:   Please see the history of present illness.    No complaints.  All other systems reviewed and are negative.  EKGs/Labs/Other Studies Reviewed:    The following studies were reviewed today: Post PCI angiogram 02/05/2016: Post-Intervention Diagram         EKG:  EKG is ordered today.  The ekg ordered today demonstrates normal sinus rhythm with normal EKG appearance.  No change when compared to prior.  Recent Labs: No results found for requested labs within last 8760 hours.  Recent Lipid Panel    Component Value Date/Time   CHOL 97 07/31/2017 0852   TRIG 62 07/31/2017 0852   HDL 33 (L) 07/31/2017 0852   CHOLHDL 2.9 07/31/2017 0852   VLDL 12 07/31/2017 0852   LDLCALC 52 07/31/2017 0852    Physical Exam:    VS:  BP 110/88   Pulse 63   Ht '6\' 2"'$  (1.88 m)   Wt 201 lb 6.4 oz (91.4 kg)   BMI 25.86 kg/m     Wt Readings from Last 3 Encounters:  09/27/18 201 lb 6.4 oz (91.4 kg)  06/01/17 190 lb (86.2 kg)  10/15/16 190 lb 12.8 oz (86.5 kg)     GEN:  Well nourished, well developed in no acute distress HEENT: Normal NECK: No JVD. LYMPHATICS: No lymphadenopathy CARDIAC: RRR, no murmur, no gallop, no edema. VASCULAR: 2+ symmetric bilateral pulses.  No bruits. RESPIRATORY:  Clear to auscultation without rales, wheezing or rhonchi  ABDOMEN: Soft, non-tender, non-distended, No pulsatile mass, MUSCULOSKELETAL: No deformity  SKIN: Warm and dry NEUROLOGIC:  Alert and oriented x 3 PSYCHIATRIC:  Normal affect   ASSESSMENT:    1. Coronary artery disease involving native coronary artery of native heart with angina pectoris ([place])   2. Dyslipidemia, goal LDL below 70    PLAN:    In order of problems listed above:  1. No angina.  Nonobstructive LAD disease.  We discussed  secondary risk prevention including monitoring of glycemic control, LDL less than 70, blood pressure less than 130/80 mmHg, and aerobic activity greater than 150 minutes/week. 2. Most recent LDL was 1 year ago and was 52.  Lipid panel will be obtained today.  Plan clinical follow-up in 1 year.  May downwardly adjust his statin intensity based upon today's lipid panel.  A lipid panel, hemoglobin O2D, and metabolic profile will be obtained today.  Further instructions will be dependent upon  findings.   Medication Adjustments/Labs and Tests Ordered: Current medicines are reviewed at length with the patient today.  Concerns regarding medicines are outlined above.  Orders Placed This Encounter  Procedures  . Comp Met (CMET)  . Lipid Profile  . HgB A1c   Meds ordered this encounter  Medications  . atorvastatin (LIPITOR) 80 MG tablet    Sig: Take 1 tablet (80 mg total) by mouth daily at 6 PM.    Dispense:  90 tablet    Refill:  3  . metoprolol succinate (TOPROL-XL) 25 MG 24 hr tablet    Sig: Take 1 tablet (25 mg total) by mouth daily.    Dispense:  90 tablet    Refill:  3  . nitroGLYCERIN (NITROSTAT) 0.4 MG SL tablet    Sig: Place 1 tablet (0.4 mg total) under the tongue every 5 (five) minutes x 3 doses as needed for chest pain (Max 3rd dose call 911).    Dispense:  25 tablet    Refill:  6    Patient Instructions  Medication Instructions:  Your physician recommends that you continue on your current medications as directed. Please refer to the Current Medication list given to you today.  If you need a refill on your cardiac medications before your next appointment, please call your pharmacy.   Lab work: Lab work to be done today--CMET, Lipid profile, HgBA1c If you have labs (blood work) drawn today and your tests are completely normal, you will receive your results only by: Marland Kitchen MyChart Message (if you have MyChart) OR . A paper copy in the mail If you have any lab test that is abnormal  or we need to change your treatment, we will call you to review the results.  Testing/Procedures: none  Follow-Up: At [facility], you and your health needs are our priority.  As part of our continuing mission to provide you with exceptional heart care, we have created designated Provider Care Teams.  These Care Teams include your primary Cardiologist (physician) and Advanced Practice Providers (APPs -  Physician Assistants and Nurse Practitioners) who all work together to provide you with the care you need, when you need it. You will need a follow up appointment in 12 months.  Please call our office 2 months in advance to schedule this appointment.  You may see Sinclair Grooms, MD or one of the following Advanced Practice Providers on your designated Care Team:   Truitt Merle, NP Cecilie Kicks, NP . Kathyrn Drown, NP  Any Other Special Instructions Will Be Listed Below (If Applicable).        Signed, Sinclair Grooms, MD  09/27/2018 5:31 PM    [place]

## 2018-09-27 ENCOUNTER — Encounter: Payer: Self-pay | Admitting: Interventional Cardiology

## 2018-09-27 ENCOUNTER — Ambulatory Visit: Payer: BC Managed Care – PPO | Admitting: Interventional Cardiology

## 2018-09-27 VITALS — BP 110/88 | HR 63 | Ht 74.0 in | Wt 201.4 lb

## 2018-09-27 DIAGNOSIS — E785 Hyperlipidemia, unspecified: Secondary | ICD-10-CM | POA: Diagnosis not present

## 2018-09-27 DIAGNOSIS — I25119 Atherosclerotic heart disease of native coronary artery with unspecified angina pectoris: Secondary | ICD-10-CM | POA: Diagnosis not present

## 2018-09-27 MED ORDER — METOPROLOL SUCCINATE ER 25 MG PO TB24: 25.0000 mg | ORAL_TABLET | Freq: Every day | ORAL | 3 refills | Status: DC

## 2018-09-27 MED ORDER — ATORVASTATIN CALCIUM 80 MG PO TABS: 80.0000 mg | ORAL_TABLET | Freq: Every day | ORAL | 3 refills | Status: DC

## 2018-09-27 MED ORDER — NITROGLYCERIN 0.4 MG SL SUBL: 0.4000 mg | SUBLINGUAL_TABLET | SUBLINGUAL | 6 refills | Status: DC | PRN

## 2018-09-27 NOTE — Patient Instructions (Signed)
Medication Instructions:  Your physician recommends that you continue on your current medications as directed. Please refer to the Current Medication list given to you today.  If you need a refill on your cardiac medications before your next appointment, please call your pharmacy.   Lab work: Lab work to be done today--CMET, Lipid profile, HgBA1c If you have labs (blood work) drawn today and your tests are completely normal, you will receive your results only by: Marland Kitchen MyChart Message (if you have MyChart) OR . A paper copy in the mail If you have any lab test that is abnormal or we need to change your treatment, we will call you to review the results.  Testing/Procedures: none  Follow-Up: At [facility], you and your health needs are our priority.  As part of our continuing mission to provide you with exceptional heart care, we have created designated Provider Care Teams.  These Care Teams include your primary Cardiologist (physician) and Advanced Practice Providers (APPs -  Physician Assistants and Nurse Practitioners) who all work together to provide you with the care you need, when you need it. You will need a follow up appointment in 12 months.  Please call our office 2 months in advance to schedule this appointment.  You may see Sinclair Grooms, MD or one of the following Advanced Practice Providers on your designated Care Team:   Truitt Merle, NP Cecilie Kicks, NP . Kathyrn Drown, NP  Any Other Special Instructions Will Be Listed Below (If Applicable).

## 2018-09-28 LAB — COMPREHENSIVE METABOLIC PANEL
A/G RATIO: 1.9 (ref 1.2–2.2)
ALBUMIN: 4.5 g/dL (ref 3.5–5.5)
ALK PHOS: 64 IU/L (ref 39–117)
ALT: 45 IU/L — ABNORMAL HIGH (ref 0–44)
AST: 35 IU/L (ref 0–40)
BILIRUBIN TOTAL: 0.4 mg/dL (ref 0.0–1.2)
BUN / CREAT RATIO: 9 (ref 9–20)
BUN: 8 mg/dL (ref 6–24)
CHLORIDE: 99 mmol/L (ref 96–106)
CO2: 23 mmol/L (ref 20–29)
Calcium: 9.1 mg/dL (ref 8.7–10.2)
Creatinine, Ser: 0.9 mg/dL (ref 0.76–1.27)
GFR calc non Af Amer: 102 mL/min/{1.73_m2} (ref >59)
GFR, EST AFRICAN AMERICAN: 118 mL/min/{1.73_m2} (ref >59)
Globulin, Total: 2.4 g/dL (ref 1.5–4.5)
Glucose: 87 mg/dL (ref 65–99)
POTASSIUM: 4.2 mmol/L (ref 3.5–5.2)
Sodium: 139 mmol/L (ref 134–144)
TOTAL PROTEIN: 6.9 g/dL (ref 6.0–8.5)

## 2018-09-28 LAB — HEMOGLOBIN A1C
Est. average glucose Bld gHb Est-mCnc: 123 mg/dL
HEMOGLOBIN A1C: 5.9 % — AB (ref 4.8–5.6)

## 2018-09-28 LAB — LIPID PANEL
CHOLESTEROL TOTAL: 113 mg/dL (ref 100–199)
Chol/HDL Ratio: 3.2 ratio (ref 0.0–5.0)
HDL: 35 mg/dL — ABNORMAL LOW (ref >39)
LDL Calculated: 58 mg/dL (ref 0–99)
Triglycerides: 102 mg/dL (ref 0–149)
VLDL Cholesterol Cal: 20 mg/dL (ref 5–40)

## 2018-09-29 ENCOUNTER — Other Ambulatory Visit: Payer: Self-pay

## 2018-09-29 ENCOUNTER — Telehealth: Payer: Self-pay | Admitting: Interventional Cardiology

## 2018-09-29 MED ORDER — ATORVASTATIN CALCIUM 80 MG PO TABS: 80.0000 mg | ORAL_TABLET | Freq: Every day | ORAL | 3 refills | Status: DC

## 2018-09-29 MED ORDER — NITROGLYCERIN 0.4 MG SL SUBL: 0.4000 mg | SUBLINGUAL_TABLET | SUBLINGUAL | 11 refills | Status: DC | PRN

## 2018-09-29 MED ORDER — METOPROLOL SUCCINATE ER 25 MG PO TB24: 25.0000 mg | ORAL_TABLET | Freq: Every day | ORAL | 3 refills | Status: DC

## 2018-09-29 NOTE — Telephone Encounter (Signed)
  Patient was returning call regarding his results. He will be in a meeting until 10 am so please call back anytime after that

## 2018-09-29 NOTE — Addendum Note (Signed)
Addended by: Velna Ochs on: 09/29/2018 10:13 AM   Modules accepted: Orders

## 2018-09-29 NOTE — Telephone Encounter (Signed)
Left voicemail for the patient to call the office. 

## 2018-09-29 NOTE — Telephone Encounter (Signed)
Informed patient of results; informed to call the office with any further questions or concerns. Refills have been sent.

## 2018-10-05 ENCOUNTER — Encounter: Payer: Self-pay | Admitting: *Deleted

## 2019-01-04 ENCOUNTER — Telehealth: Payer: Self-pay | Admitting: Interventional Cardiology

## 2019-01-04 NOTE — Telephone Encounter (Signed)
New Message     Pt called and wanted an appt. He said he have been having a very mild tingling in his chest,maybe once or twice a week. Pt said he had no other symptoms. I made pt an appointment for Thursday, he requested an appointment  for Thursday. Please call to evaluate.ent

## 2019-01-04 NOTE — Telephone Encounter (Signed)
Pt states for about 2 months now he has noticed some tingling in his chest that radiates into his left arm down to fingertips.  Nothing to explain why it comes on and nothing helps to make it go away.  It lasts an hour or less but is different each time.  This has progressively worsened over the 2 months and is happening a 2-3x per week at this point.  Denies CP, lightheadedness, dizziness, SOB or other issues.  Pt works out regularly and at peak exercise HR is 140-150 and comes back down to normal after cool down.  Scheduled pt to see Dr. Tamala Julian on Thursday for evaluation.  Pt appreciative for call.

## 2019-01-05 NOTE — Progress Notes (Signed)
Cardiology Office Note:    Date:  01/06/2019   ID:  Paul Sullivan, DOB [birth year], MRN [id number]  PCP:  Chesley Noon, MD  Cardiologist:  Sinclair Grooms, MD   Referring MD: Chesley Noon, MD   Chief Complaint  Patient presents with  . Chest Pain  . Coronary Artery Disease    History of Present Illness:    Paul Sullivan is a 48 y.o. male with a hx of CAD with prior inferior infarct, right coronary DES, residual 60-80% mid LAD with negative Cardiolite in March of 2017, and hyperlipidemia.  Paul Sullivan is here for an earlier appointment.  Over the past 6 weeks he has been experiencing very mild but noticeable substernal discomfort that he describes as a "tingle".  He is still very active, doing the elliptical and other physical activities greater than 150 minutes/week.  Physical activity does not precipitate the discomfort.  The discomfort occurs at rest, is noticeable but not intense.  He grades the discomfort at a level of 2/10.  He can last up to 60 to 90 minutes.  There is no associated dyspnea, radiation, diaphoresis, or other complaints.  He has not tried sublingual nitroglycerin.  Episodes are not associated with diet/food intake.  He denies claudication, exertional dyspnea, exertional chest discomfort, medication side effects, palpitations, syncope.    Past Medical History:  Diagnosis Date  . CAD (coronary artery disease)    a. Inf STEMI 3/17 - LHC pLAD 80, dLAD 50/60, oLCx 30, mRCA 100, 50, Normal LVF >> PCI - DES to RCA;  b. OP nuc stress study to assess LAD territory ischemia  . Dyslipidemia, goal LDL below 70 02/07/2016    Past Surgical History:  Procedure Laterality Date  . CARDIAC CATHETERIZATION N/A 02/05/2016   Procedure: Left Heart Cath and Coronary Angiography;  Surgeon: Belva Crome, MD;  Location: [facility];  Service: Cardiovascular;  Laterality: N/A;    Current Medications: Current Meds  Medication Sig  . aspirin 81 MG  chewable tablet Chew 1 tablet (81 mg total) by mouth daily.  Marland Kitchen atorvastatin (LIPITOR) 80 MG tablet Take 1 tablet (80 mg total) by mouth daily at 6 PM.  . metoprolol succinate (TOPROL-XL) 25 MG 24 hr tablet Take 1 tablet (25 mg total) by mouth daily.  . nitroGLYCERIN (NITROSTAT) 0.4 MG SL tablet Place 1 tablet (0.4 mg total) under the tongue every 5 (five) minutes x 3 doses as needed for chest pain (Max 3rd dose call 911).  . [DISCONTINUED] nitroGLYCERIN (NITROSTAT) 0.4 MG SL tablet Place 1 tablet (0.4 mg total) under the tongue every 5 (five) minutes x 3 doses as needed for chest pain (Max 3rd dose call 911).     Allergies:   Patient has no known allergies.   Social History   Socioeconomic History  . Marital status: Married    Spouse name: Not on file  . Number of children: Not on file  . Years of education: Not on file  . Highest education level: Not on file  Occupational History  . Occupation: [employer]  Social Needs  . Financial resource strain: Not on file  . Food insecurity:    Worry: Not on file    Inability: Not on file  . Transportation needs:    Medical: Not on file    Non-medical: Not on file  Tobacco Use  . Smoking status: Never Smoker  . Smokeless tobacco: Never Used  Substance and  Sexual Activity  . Alcohol use: No    Alcohol/week: 0.0 standard drinks  . Drug use: No  . Sexual activity: Not on file  Lifestyle  . Physical activity:    Days per week: Not on file    Minutes per session: Not on file  . Stress: Not on file  Relationships  . Social connections:    Talks on phone: Not on file    Gets together: Not on file    Attends religious service: Not on file    Active member of club or organization: Not on file    Attends meetings of clubs or organizations: Not on file    Relationship status: Not on file  Other Topics Concern  . Not on file  Social History Narrative   Lives in [place] with family     Family History: The  patient's family history includes Coronary artery disease in his mother; Heart failure in his maternal uncle; Hyperlipidemia in his father and mother; Hypertension in his father and mother.  ROS:   Please see the history of present illness.    No additional complaints.  All other systems reviewed and are negative.  EKGs/Labs/Other Studies Reviewed:    The following studies were reviewed today: No new cardiac data. Review of prior coronary angiogram performed March 2017: Diagnostic  Dominance: Co-dominant    Intervention      Nuclear myocardial perfusion study performed February 19, 2016: Study Highlights    Nuclear stress EF: 54%.  There was no ST segment deviation noted during stress.  Defect 1: There is a medium defect of moderate severity present in the basal inferior, basal inferolateral, mid inferior and mid inferolateral location.  Findings consistent with ischemia.  This is an intermediate risk study.  The left ventricular ejection fraction is normal (55-65%).   There is medium size, moderate severity reversible defect in the basal and mid inferior and inferolateral walls (SDS = 5).      EKG:  EKG sinus bradycardia, nonspecific ST abnormality, and overall normal appearance other than sinus bradycardia.  Recent Labs: 09/27/2018: ALT 45; BUN 8; Creatinine, Ser 0.90; Potassium 4.2; Sodium 139  Recent Lipid Panel    Component Value Date/Time   CHOL 113 09/27/2018 1647   TRIG 102 09/27/2018 1647   HDL 35 (L) 09/27/2018 1647   CHOLHDL 3.2 09/27/2018 1647   CHOLHDL 2.9 07/31/2017 0852   VLDL 12 07/31/2017 0852   LDLCALC 58 09/27/2018 1647    Physical Exam:    VS:  BP 124/82   Pulse (!) 57   Ht 6\' 2"  (1.88 m)   Wt 196 lb 9.6 oz (89.2 kg)   SpO2 96%   BMI 25.24 kg/m     Wt Readings from Last 3 Encounters:  01/06/19 196 lb 9.6 oz (89.2 kg)  09/27/18 201 lb 6.4 oz (91.4 kg)  06/01/17 190 lb (86.2 kg)     GEN: No acute appearing. No acute  distress HEENT: Normal NECK: No JVD. LYMPHATICS: No lymphadenopathy CARDIAC: RRR.  No murmur, no gallop, no edema VASCULAR: 2+ bilateral radial and carotid pulses, no bruits RESPIRATORY:  Clear to auscultation without rales, wheezing or rhonchi  ABDOMEN: Soft, non-tender, non-distended, No pulsatile mass, MUSCULOSKELETAL: No deformity  SKIN: Warm and dry NEUROLOGIC:  Alert and oriented x 3 PSYCHIATRIC:  Normal affect   ASSESSMENT:    1. Coronary artery disease involving native coronary artery of native heart with angina pectoris ([place])   2. Dyslipidemia, goal LDL below 70  3. Chest tightness    PLAN:    In order of problems listed above:  1. With residual borderline LAD stenosis in 2017 and negative nuclear study, I am not concerned about progression.  Symptoms currently are atypical so I will not jump straight to coronary angiography.  Plan will be to perform stress Myoview and compare imaging from prior study.  Also have requested that the patient use sublingual nitroglycerin if recurrent chest discomfort.  If nitroglycerin improves/relieves the discomfort he will need coronary angiography.  If the nuclear study demonstrates ischemia he will need coronary angiography.  For now carry on with typical activities.  No exercise of chest discomfort is precipitated by activity. 2. LDL target less than 70. 3. See discourse above.  Level will be pending the findings of the nuclear study and response to nitroglycerin.  I discussed that he may need coronary angiography.  Also cautioned him that if discomfort is not relieved by nitro he should consider coming in for evaluation during chest pain.  If episodes of discomfort become more frequent or prolonged he should also consider coming in for evaluation even if it means going to the emergency room.  Nuclear study would be done as soon as possible.   Medication Adjustments/Labs and Tests Ordered: Current medicines are reviewed at length with  the patient today.  Concerns regarding medicines are outlined above.  Orders Placed This Encounter  Procedures  . MYOCARDIAL PERFUSION IMAGING  . EKG 12-Lead   Meds ordered this encounter  Medications  . nitroGLYCERIN (NITROSTAT) 0.4 MG SL tablet    Sig: Place 1 tablet (0.4 mg total) under the tongue every 5 (five) minutes x 3 doses as needed for chest pain (Max 3rd dose call 911).    Dispense:  25 tablet    Refill:  6    Patient Instructions  Medication Instructions:  Your physician recommends that you continue on your current medications as directed. Please refer to the Current Medication list given to you today.  If you need a refill on your cardiac medications before your next appointment, please call your pharmacy.   Lab work: None Ordered    Testing/Procedures: Your physician has requested that you have an exercise stress myoview. For further information please visit [email or web address]. Please follow instruction sheet, as given.    Follow-Up: At [facility], you and your health needs are our priority.  As part of our continuing mission to provide you with exceptional heart care, we have created designated Provider Care Teams.  These Care Teams include your primary Cardiologist (physician) and Advanced Practice Providers (APPs -  Physician Assistants and Nurse Practitioners) who all work together to provide you with the care you need, when you need it. You will need a follow up appointment in  as needed.  You may see Sinclair Grooms, MD or one of the following Advanced Practice Providers on your designated Care Team:   Truitt Merle, NP Cecilie Kicks, NP . Kathyrn Drown, NP       Signed, Sinclair Grooms, MD  01/06/2019 11:06 AM    [place]

## 2019-01-06 ENCOUNTER — Encounter: Payer: Self-pay | Admitting: Nurse Practitioner

## 2019-01-06 ENCOUNTER — Ambulatory Visit: Payer: BC Managed Care – PPO | Admitting: Cardiology

## 2019-01-06 ENCOUNTER — Encounter: Payer: Self-pay | Admitting: Interventional Cardiology

## 2019-01-06 ENCOUNTER — Encounter (INDEPENDENT_AMBULATORY_CARE_PROVIDER_SITE_OTHER): Payer: Self-pay

## 2019-01-06 ENCOUNTER — Ambulatory Visit: Payer: BC Managed Care – PPO | Admitting: Interventional Cardiology

## 2019-01-06 VITALS — BP 124/82 | HR 57 | Ht 74.0 in | Wt 196.6 lb

## 2019-01-06 DIAGNOSIS — E785 Hyperlipidemia, unspecified: Secondary | ICD-10-CM

## 2019-01-06 DIAGNOSIS — R0789 Other chest pain: Secondary | ICD-10-CM | POA: Diagnosis not present

## 2019-01-06 DIAGNOSIS — I25119 Atherosclerotic heart disease of native coronary artery with unspecified angina pectoris: Secondary | ICD-10-CM | POA: Diagnosis not present

## 2019-01-06 MED ORDER — NITROGLYCERIN 0.4 MG SL SUBL: 0.4000 mg | SUBLINGUAL_TABLET | SUBLINGUAL | 6 refills | Status: AC | PRN

## 2019-01-06 NOTE — Patient Instructions (Signed)
Medication Instructions:  Your physician recommends that you continue on your current medications as directed. Please refer to the Current Medication list given to you today.  If you need a refill on your cardiac medications before your next appointment, please call your pharmacy.   Lab work: None Ordered    Testing/Procedures: Your physician has requested that you have an exercise stress myoview. For further information please visit [email or web address]. Please follow instruction sheet, as given.    Follow-Up: At [facility], you and your health needs are our priority.  As part of our continuing mission to provide you with exceptional heart care, we have created designated Provider Care Teams.  These Care Teams include your primary Cardiologist (physician) and Advanced Practice Providers (APPs -  Physician Assistants and Nurse Practitioners) who all work together to provide you with the care you need, when you need it. You will need a follow up appointment in  as needed.  You may see Sinclair Grooms, MD or one of the following Advanced Practice Providers on your designated Care Team:   Truitt Merle, NP Cecilie Kicks, NP . Kathyrn Drown, NP

## 2019-01-11 ENCOUNTER — Telehealth (HOSPITAL_COMMUNITY): Payer: Self-pay | Admitting: *Deleted

## 2019-01-11 NOTE — Telephone Encounter (Signed)
Patient given detailed instructions per Myocardial Perfusion Study Information Sheet for the test on 01/17/19 at 1000. Patient notified to arrive 15 minutes early and that it is imperative to arrive on time for appointment to keep from having the test rescheduled.  If you need to cancel or reschedule your appointment, please call the office within 24 hours of your appointment. . Patient verbalized understanding.Christine Morton, Lucius Conn letter sent

## 2019-01-17 ENCOUNTER — Ambulatory Visit (HOSPITAL_COMMUNITY): Payer: BC Managed Care – PPO

## 2019-01-20 ENCOUNTER — Ambulatory Visit (HOSPITAL_COMMUNITY): Payer: BC Managed Care – PPO | Attending: Cardiovascular Disease

## 2019-01-20 VITALS — Ht 74.0 in | Wt 196.0 lb

## 2019-01-20 DIAGNOSIS — I252 Old myocardial infarction: Secondary | ICD-10-CM | POA: Diagnosis present

## 2019-01-20 DIAGNOSIS — I25119 Atherosclerotic heart disease of native coronary artery with unspecified angina pectoris: Secondary | ICD-10-CM | POA: Diagnosis not present

## 2019-01-20 DIAGNOSIS — R0789 Other chest pain: Secondary | ICD-10-CM

## 2019-01-20 DIAGNOSIS — E785 Hyperlipidemia, unspecified: Secondary | ICD-10-CM

## 2019-01-20 MED ORDER — TECHNETIUM TC 99M TETROFOSMIN IV KIT
11.0000 | PACK | Freq: Once | INTRAVENOUS | Status: AC | PRN
  Administered 2019-01-20: 11 via INTRAVENOUS
  Filled 2019-01-20: qty 11

## 2019-01-20 MED ORDER — TECHNETIUM TC 99M TETROFOSMIN IV KIT
32.0000 | PACK | Freq: Once | INTRAVENOUS | Status: AC | PRN
  Administered 2019-01-20: 32 via INTRAVENOUS
  Filled 2019-01-20: qty 32

## 2019-01-21 LAB — MYOCARDIAL PERFUSION IMAGING
CHL CUP MPHR: 173 {beats}/min
CHL CUP NUCLEAR SSS: 0
CHL CUP RESTING HR STRESS: 68 {beats}/min
CSEPEDS: 32 s
Estimated workload: 12.5 METS
Exercise duration (min): 10 min
LVDIAVOL: 92 mL (ref 62–150)
LVSYSVOL: 43 mL
Peak HR: 160 {beats}/min
Percent HR: 92 %
SDS: 0
SRS: 0
TID: 0.97

## 2019-01-27 NOTE — Progress Notes (Signed)
Cardiology Office Note:    Date:  01/28/2019   ID:  Paul Sullivan, DOB 06-Dec-1970, MRN [id number]  PCP:  Chesley Noon, MD  Cardiologist:  Sinclair Grooms, MD   Referring MD: Chesley Noon, MD   Chief Complaint  Patient presents with  . Coronary Artery Disease    History of Present Illness:    Paul Sullivan is a 48 y.o. male with a hx of CAD with prior inferior infarct, right coronary DES, residual 60-80% mid LAD with negative Cardiolite in March of 2017, and hyperlipidemia..  Discomfort in the precordium occurring, usually in the mornings, and described as pressure..  Plus minus response to sublingual nitroglycerin.  Symptoms can last greater than an hour.  Nitroglycerin may slightly decrease the intensity but does not dramatically improve it.  Intensity is in the 1-3 over 10 level.  Exercise seems to help improve the discomfort.  There is no associated dyspnea, orthopnea, or PND.  Exercise treadmill test performed greater than 8 minutes was associated nondiagnostic ST segment depression.  Nuclear images demonstrated apical perfusion defect that was relatively fixed.  Past Medical History:  Diagnosis Date  . CAD (coronary artery disease)    a. Inf STEMI 3/17 - LHC pLAD 80, dLAD 50/60, oLCx 30, mRCA 100, 50, Normal LVF >> PCI - DES to RCA;  b. OP nuc stress study to assess LAD territory ischemia  . Dyslipidemia, goal LDL below 70 02/07/2016    Past Surgical History:  Procedure Laterality Date  . CARDIAC CATHETERIZATION N/A 02/05/2016   Procedure: Left Heart Cath and Coronary Angiography;  Surgeon: Belva Crome, MD;  Location: [facility];  Service: Cardiovascular;  Laterality: N/A;    Current Medications: Current Meds  Medication Sig  . aspirin 81 MG chewable tablet Chew 1 tablet (81 mg total) by mouth daily.  Marland Kitchen atorvastatin (LIPITOR) 80 MG tablet Take 1 tablet (80 mg total) by mouth daily at 6 PM.  . metoprolol succinate (TOPROL-XL) 25 MG  24 hr tablet Take 1 tablet (25 mg total) by mouth daily.  . nitroGLYCERIN (NITROSTAT) 0.4 MG SL tablet Place 1 tablet (0.4 mg total) under the tongue every 5 (five) minutes x 3 doses as needed for chest pain (Max 3rd dose call 911).     Allergies:   Patient has no known allergies.   Social History   Socioeconomic History  . Marital status: Married    Spouse name: Not on file  . Number of children: Not on file  . Years of education: Not on file  . Highest education level: Not on file  Occupational History  . Occupation: [employer]  Social Needs  . Financial resource strain: Not on file  . Food insecurity:    Worry: Not on file    Inability: Not on file  . Transportation needs:    Medical: Not on file    Non-medical: Not on file  Tobacco Use  . Smoking status: Never Smoker  . Smokeless tobacco: Never Used  Substance and Sexual Activity  . Alcohol use: No    Alcohol/week: 0.0 standard drinks  . Drug use: No  . Sexual activity: Not on file  Lifestyle  . Physical activity:    Days per week: Not on file    Minutes per session: Not on file  . Stress: Not on file  Relationships  . Social connections:    Talks on phone: Not on file  Gets together: Not on file    Attends religious service: Not on file    Active member of club or organization: Not on file    Attends meetings of clubs or organizations: Not on file    Relationship status: Not on file  Other Topics Concern  . Not on file  Social History Narrative   Lives in [place] with family     Family History: The patient's family history includes Coronary artery disease in his mother; Heart failure in his maternal uncle; Hyperlipidemia in his father and mother; Hypertension in his father and mother.  ROS:   Please see the history of present illness.    No other complaints.  Concerned that his father died of leukemia 1 year ago.  All other systems reviewed and are negative.  EKGs/Labs/Other  Studies Reviewed:    The following studies were reviewed today:  Myocardial perfusion imaging 01/20/2019: Study Highlights     Nuclear stress EF: 54%.  The left ventricular ejection fraction is mildly decreased (45-54%).  Blood pressure demonstrated a normal response to exercise.  Upsloping ST segment depression ST segment depression of 1 mm was noted during stress in the II, Sullivan and V5 leads, beginning at 8 minutes of stress, and returning to baseline after less than 1 minute of recovery.  Defect 1: There is a small defect of moderate severity present in the mid inferior, apical inferior and apex location.  Findings consistent with prior myocardial infarction.  This is a low risk study.   Small, moderate, fixed defect in the apex, infero apex and mid inferior wall. No ischemia on ECG.      EKG:  EKG ECG at baseline prior to these stress myocardial perfusion study on January 20, 2019 is normal preexercise.  Normal sinus rhythm.  No evidence of infarction.  Recent Labs: 09/27/2018: ALT 45; BUN 8; Creatinine, Ser 0.90; Potassium 4.2; Sodium 139  Recent Lipid Panel    Component Value Date/Time   CHOL 113 09/27/2018 1647   TRIG 102 09/27/2018 1647   HDL 35 (L) 09/27/2018 1647   CHOLHDL 3.2 09/27/2018 1647   CHOLHDL 2.9 07/31/2017 0852   VLDL 12 07/31/2017 0852   LDLCALC 58 09/27/2018 1647    Physical Exam:    VS:  BP 124/84   Pulse 71   Ht 6\' 2"  (1.88 m)   Wt 199 lb 6.4 oz (90.4 kg)   SpO2 97%   BMI 25.60 kg/m     Wt Readings from Last 3 Encounters:  01/28/19 199 lb 6.4 oz (90.4 kg)  01/20/19 196 lb (88.9 kg)  01/17/19 196 lb (88.9 kg)     GEN: Healthy-appearing. No acute distress HEENT: Normal NECK: No JVD. LYMPHATICS: No lymphadenopathy CARDIAC: RRR.  No murmur, no gallop, no edema VASCULAR: 2+ bilateral radial and carotid pulses, no bruits RESPIRATORY:  Clear to auscultation without rales, wheezing or rhonchi  ABDOMEN: Soft, non-tender, non-distended,  No pulsatile mass, MUSCULOSKELETAL: No deformity  SKIN: Warm and dry NEUROLOGIC:  Alert and oriented x 3 PSYCHIATRIC:  Normal affect   ASSESSMENT:    1. Chest tightness   2. 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 Coronary artery disease involving native coronary artery of native heart with angina pectoris ([place])   4. Dyslipidemia, goal LDL below 70    PLAN:    In order of problems listed above:  1. This exists in the presence of known residual LAD disease and prior RCA stent.  Nuclear study when personally  reviewed raises question of mild mid anterior wall ischemia there is also an anteroapical defect in a distribution that was not covered by the right coronary.  After considering that the patient's symptoms have been ongoing for 2 to 3 months without good explanation, I have recommended coronary angiography to exclude RCA in-stent restenosis and/or progression of LAD disease.  Procedure and risks were discussed in detail with the patient including death, myocardial infarction, stroke, bleeding, limb ischemia, allergy, emergency surgery, and radiation injury. 2. Small inferoapical scar noted on nuclear scintigraphy 3. As noted above residual disease at the time of angiography in 2017 during acute inferior infarction. 4. LDL has been at target less than or equal to 70.  The patient was counseled to undergo left heart catheterization, coronary angiography, and possible percutaneous coronary intervention with stent implantation. The procedural risks and benefits were discussed in detail. The risks discussed included death, stroke, myocardial infarction, life-threatening bleeding, limb ischemia, kidney injury, allergy, and possible emergency cardiac surgery. The risk of these significant complications were estimated to occur less than 1% of the time. After discussion, the patient has agreed to proceed.  Greater than 50% of the time during this office visit was spent in education, counseling, and  coordination of care related to underlying disease process and testing as outlined.    Medication Adjustments/Labs and Tests Ordered: Current medicines are reviewed at length with the patient today.  Concerns regarding medicines are outlined above.  Orders Placed This Encounter  Procedures  . Basic metabolic panel  . CBC   No orders of the defined types were placed in this encounter.   Patient Instructions  Medication Instructions:  Your physician recommends that you continue on your current medications as directed. Please refer to the Current Medication list given to you today.  If you need a refill on your cardiac medications before your next appointment, please call your pharmacy.   Lab work: BMET, CBC today  If you have labs (blood work) drawn today and your tests are completely normal, you will receive your results only by: Marland Kitchen MyChart Message (if you have MyChart) OR . A paper copy in the mail If you have any lab test that is abnormal or we need to change your treatment, we will call you to review the results.  Testing/Procedures: Your physician has requested that you have a cardiac catheterization. Cardiac catheterization is used to diagnose and/or treat various heart conditions. Doctors may recommend this procedure for a number of different reasons. The most common reason is to evaluate chest pain. Chest pain can be a symptom of coronary artery disease (CAD), and cardiac catheterization can show whether plaque is narrowing or blocking your heart's arteries. This procedure is also used to evaluate the valves, as well as measure the blood flow and oxygen levels in different parts of your heart. For further information please visit [email or web address]. Please follow instruction sheet, as given.   Follow-Up: At [facility], you and your health needs are our priority.  As part of our continuing mission to provide you with exceptional heart care, we have created designated  Provider Care Teams.  These Care Teams include your primary Cardiologist (physician) and Advanced Practice Providers (APPs -  Physician Assistants and Nurse Practitioners) who all work together to provide you with the care you need, when you need it. You will need a follow up appointment in 2-3 weeks.  Please call our office 2 months in advance to schedule this appointment.  You may see Mallie Mussel  Carlye Grippe, MD or one of the following Advanced Practice Providers on your designated Care Team:   Truitt Merle, NP Cecilie Kicks, NP . Kathyrn Drown, NP  Any Other Special Instructions Will Be Listed Below (If Applicable).     [place] OFFICE [place], [place] [address] Dept: [phone] Loc: [phone]  Paul Sullivan  01/28/2019  You are scheduled for a Cardiac Catheterization on Monday, March 9 with Dr. Daneen Schick.  1. Please arrive at the [facility] (Main Entrance A) at [facility]: [address] [place], [place] [address] at 7:00 AM (This time is two hours before your procedure to ensure your preparation). Free valet parking service is available.   Special note: Every effort is made to have your procedure done on time. Please understand that emergencies sometimes delay scheduled procedures.  2. Diet: Do not eat solid foods after midnight.  The patient may have clear liquids until 5am upon the day of the procedure.  3. Labs: You will need to have blood drawn today  4. Medication instructions in preparation for your procedure:   Contrast Allergy: No   On the morning of your procedure, take your Aspirin and any morning medicines NOT listed above.  You may use sips of water.  5. Plan for one night stay--bring personal belongings. 6. Bring a current list of your medications and current insurance cards. 7. You MUST have a responsible person to drive you home. 8.  Someone MUST be with you the first 24 hours after you arrive home or your discharge will be delayed. 9. Please wear clothes that are easy to get on and off and wear slip-on shoes.  Thank you for allowing [place] to care for you!   -- [facility]       Signed, Sinclair Grooms, MD  01/28/2019 8:31 AM    [place]

## 2019-01-27 NOTE — H&P (View-Only) (Signed)
Cardiology Office Note:    Date:  01/28/2019   ID:  Paul Sullivan, DOB Apr 22, 1971, MRN [id number]  PCP:  Chesley Noon, MD  Cardiologist:  Sinclair Grooms, MD   Referring MD: Chesley Noon, MD   Chief Complaint  Patient presents with  . Coronary Artery Disease    History of Present Illness:    Paul Sullivan is a 48 y.o. male with a hx of CAD with prior inferior infarct, right coronary DES, residual 60-80% mid LAD with negative Cardiolite in March of 2017, and hyperlipidemia..  Discomfort in the precordium occurring, usually in the mornings, and described as pressure..  Plus minus response to sublingual nitroglycerin.  Symptoms can last greater than an hour.  Nitroglycerin may slightly decrease the intensity but does not dramatically improve it.  Intensity is in the 1-3 over 10 level.  Exercise seems to help improve the discomfort.  There is no associated dyspnea, orthopnea, or PND.  Exercise treadmill test performed greater than 8 minutes was associated nondiagnostic ST segment depression.  Nuclear images demonstrated apical perfusion defect that was relatively fixed.  Past Medical History:  Diagnosis Date  . CAD (coronary artery disease)    a. Inf STEMI 3/17 - LHC pLAD 80, dLAD 50/60, oLCx 30, mRCA 100, 50, Normal LVF >> PCI - DES to RCA;  b. OP nuc stress study to assess LAD territory ischemia  . Dyslipidemia, goal LDL below 70 02/07/2016    Past Surgical History:  Procedure Laterality Date  . CARDIAC CATHETERIZATION N/A 02/05/2016   Procedure: Left Heart Cath and Coronary Angiography;  Surgeon: Belva Crome, MD;  Location: [facility];  Service: Cardiovascular;  Laterality: N/A;    Current Medications: Current Meds  Medication Sig  . aspirin 81 MG chewable tablet Chew 1 tablet (81 mg total) by mouth daily.  Marland Kitchen atorvastatin (LIPITOR) 80 MG tablet Take 1 tablet (80 mg total) by mouth daily at 6 PM.  . metoprolol succinate (TOPROL-XL) 25 MG  24 hr tablet Take 1 tablet (25 mg total) by mouth daily.  . nitroGLYCERIN (NITROSTAT) 0.4 MG SL tablet Place 1 tablet (0.4 mg total) under the tongue every 5 (five) minutes x 3 doses as needed for chest pain (Max 3rd dose call 911).     Allergies:   Patient has no known allergies.   Social History   Socioeconomic History  . Marital status: Married    Spouse name: Not on file  . Number of children: Not on file  . Years of education: Not on file  . Highest education level: Not on file  Occupational History  . Occupation: [employer]  Social Needs  . Financial resource strain: Not on file  . Food insecurity:    Worry: Not on file    Inability: Not on file  . Transportation needs:    Medical: Not on file    Non-medical: Not on file  Tobacco Use  . Smoking status: Never Smoker  . Smokeless tobacco: Never Used  Substance and Sexual Activity  . Alcohol use: No    Alcohol/week: 0.0 standard drinks  . Drug use: No  . Sexual activity: Not on file  Lifestyle  . Physical activity:    Days per week: Not on file    Minutes per session: Not on file  . Stress: Not on file  Relationships  . Social connections:    Talks on phone: Not on file  Gets together: Not on file    Attends religious service: Not on file    Active member of club or organization: Not on file    Attends meetings of clubs or organizations: Not on file    Relationship status: Not on file  Other Topics Concern  . Not on file  Social History Narrative   Lives in [place] with family     Family History: The patient's family history includes Coronary artery disease in his mother; Heart failure in his maternal uncle; Hyperlipidemia in his father and mother; Hypertension in his father and mother.  ROS:   Please see the history of present illness.    No other complaints.  Concerned that his father died of leukemia 1 year ago.  All other systems reviewed and are negative.  EKGs/Labs/Other  Studies Reviewed:    The following studies were reviewed today:  Myocardial perfusion imaging 01/20/2019: Study Highlights     Nuclear stress EF: 54%.  The left ventricular ejection fraction is mildly decreased (45-54%).  Blood pressure demonstrated a normal response to exercise.  Upsloping ST segment depression ST segment depression of 1 mm was noted during stress in the II, Sullivan and V5 leads, beginning at 8 minutes of stress, and returning to baseline after less than 1 minute of recovery.  Defect 1: There is a small defect of moderate severity present in the mid inferior, apical inferior and apex location.  Findings consistent with prior myocardial infarction.  This is a low risk study.   Small, moderate, fixed defect in the apex, infero apex and mid inferior wall. No ischemia on ECG.      EKG:  EKG ECG at baseline prior to these stress myocardial perfusion study on January 20, 2019 is normal preexercise.  Normal sinus rhythm.  No evidence of infarction.  Recent Labs: 09/27/2018: ALT 45; BUN 8; Creatinine, Ser 0.90; Potassium 4.2; Sodium 139  Recent Lipid Panel    Component Value Date/Time   CHOL 113 09/27/2018 1647   TRIG 102 09/27/2018 1647   HDL 35 (L) 09/27/2018 1647   CHOLHDL 3.2 09/27/2018 1647   CHOLHDL 2.9 07/31/2017 0852   VLDL 12 07/31/2017 0852   LDLCALC 58 09/27/2018 1647    Physical Exam:    VS:  BP 124/84   Pulse 71   Ht 6\' 2"  (1.88 m)   Wt 199 lb 6.4 oz (90.4 kg)   SpO2 97%   BMI 25.60 kg/m     Wt Readings from Last 3 Encounters:  01/28/19 199 lb 6.4 oz (90.4 kg)  01/20/19 196 lb (88.9 kg)  01/17/19 196 lb (88.9 kg)     GEN: Healthy-appearing. No acute distress HEENT: Normal NECK: No JVD. LYMPHATICS: No lymphadenopathy CARDIAC: RRR.  No murmur, no gallop, no edema VASCULAR: 2+ bilateral radial and carotid pulses, no bruits RESPIRATORY:  Clear to auscultation without rales, wheezing or rhonchi  ABDOMEN: Soft, non-tender, non-distended,  No pulsatile mass, MUSCULOSKELETAL: No deformity  SKIN: Warm and dry NEUROLOGIC:  Alert and oriented x 3 PSYCHIATRIC:  Normal affect   ASSESSMENT:    1. Chest tightness   2. 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 Coronary artery disease involving native coronary artery of native heart with angina pectoris ([place])   4. Dyslipidemia, goal LDL below 70    PLAN:    In order of problems listed above:  1. This exists in the presence of known residual LAD disease and prior RCA stent.  Nuclear study when personally  reviewed raises question of mild mid anterior wall ischemia there is also an anteroapical defect in a distribution that was not covered by the right coronary.  After considering that the patient's symptoms have been ongoing for 2 to 3 months without good explanation, I have recommended coronary angiography to exclude RCA in-stent restenosis and/or progression of LAD disease.  Procedure and risks were discussed in detail with the patient including death, myocardial infarction, stroke, bleeding, limb ischemia, allergy, emergency surgery, and radiation injury. 2. Small inferoapical scar noted on nuclear scintigraphy 3. As noted above residual disease at the time of angiography in 2017 during acute inferior infarction. 4. LDL has been at target less than or equal to 70.  The patient was counseled to undergo left heart catheterization, coronary angiography, and possible percutaneous coronary intervention with stent implantation. The procedural risks and benefits were discussed in detail. The risks discussed included death, stroke, myocardial infarction, life-threatening bleeding, limb ischemia, kidney injury, allergy, and possible emergency cardiac surgery. The risk of these significant complications were estimated to occur less than 1% of the time. After discussion, the patient has agreed to proceed.  Greater than 50% of the time during this office visit was spent in education, counseling, and  coordination of care related to underlying disease process and testing as outlined.    Medication Adjustments/Labs and Tests Ordered: Current medicines are reviewed at length with the patient today.  Concerns regarding medicines are outlined above.  Orders Placed This Encounter  Procedures  . Basic metabolic panel  . CBC   No orders of the defined types were placed in this encounter.   Patient Instructions  Medication Instructions:  Your physician recommends that you continue on your current medications as directed. Please refer to the Current Medication list given to you today.  If you need a refill on your cardiac medications before your next appointment, please call your pharmacy.   Lab work: BMET, CBC today  If you have labs (blood work) drawn today and your tests are completely normal, you will receive your results only by: Marland Kitchen MyChart Message (if you have MyChart) OR . A paper copy in the mail If you have any lab test that is abnormal or we need to change your treatment, we will call you to review the results.  Testing/Procedures: Your physician has requested that you have a cardiac catheterization. Cardiac catheterization is used to diagnose and/or treat various heart conditions. Doctors may recommend this procedure for a number of different reasons. The most common reason is to evaluate chest pain. Chest pain can be a symptom of coronary artery disease (CAD), and cardiac catheterization can show whether plaque is narrowing or blocking your heart's arteries. This procedure is also used to evaluate the valves, as well as measure the blood flow and oxygen levels in different parts of your heart. For further information please visit HugeFiesta.tn. Please follow instruction sheet, as given.   Follow-Up: At [facility], you and your health needs are our priority.  As part of our continuing mission to provide you with exceptional heart care, we have created designated  Provider Care Teams.  These Care Teams include your primary Cardiologist (physician) and Advanced Practice Providers (APPs -  Physician Assistants and Nurse Practitioners) who all work together to provide you with the care you need, when you need it. You will need a follow up appointment in 2-3 weeks.  Please call our office 2 months in advance to schedule this appointment.  You may see Mallie Mussel  Carlye Grippe, MD or one of the following Advanced Practice Providers on your designated Care Team:   Truitt Merle, NP Cecilie Kicks, NP . Kathyrn Drown, NP  Any Other Special Instructions Will Be Listed Below (If Applicable).     [place] OFFICE [place], [place] [address] Dept: [phone] Loc: [phone]  MAXIME BECKNER Sullivan  01/28/2019  You are scheduled for a Cardiac Catheterization on Monday, March 9 with Dr. Daneen Schick.  1. Please arrive at the [facility] (Main Entrance A) at [facility]: [address] [place],  [address] at 7:00 AM (This time is two hours before your procedure to ensure your preparation). Free valet parking service is available.   Special note: Every effort is made to have your procedure done on time. Please understand that emergencies sometimes delay scheduled procedures.  2. Diet: Do not eat solid foods after midnight.  The patient may have clear liquids until 5am upon the day of the procedure.  3. Labs: You will need to have blood drawn today  4. Medication instructions in preparation for your procedure:   Contrast Allergy: No   On the morning of your procedure, take your Aspirin and any morning medicines NOT listed above.  You may use sips of water.  5. Plan for one night stay--bring personal belongings. 6. Bring a current list of your medications and current insurance cards. 7. You MUST have a responsible person to drive you home. 8.  Someone MUST be with you the first 24 hours after you arrive home or your discharge will be delayed. 9. Please wear clothes that are easy to get on and off and wear slip-on shoes.  Thank you for allowing [place] to care for you!   -- [place] [facility]       Signed, Sinclair Grooms, MD  01/28/2019 8:31 AM    [place]

## 2019-01-28 ENCOUNTER — Ambulatory Visit: Payer: BC Managed Care – PPO | Admitting: Interventional Cardiology

## 2019-01-28 ENCOUNTER — Encounter: Payer: Self-pay | Admitting: Interventional Cardiology

## 2019-01-28 VITALS — BP 124/84 | HR 71 | Ht 74.0 in | Wt 199.4 lb

## 2019-01-28 DIAGNOSIS — R0789 Other chest pain: Secondary | ICD-10-CM | POA: Diagnosis not present

## 2019-01-28 DIAGNOSIS — I252 Old myocardial infarction: Secondary | ICD-10-CM

## 2019-01-28 DIAGNOSIS — E785 Hyperlipidemia, unspecified: Secondary | ICD-10-CM | POA: Diagnosis not present

## 2019-01-28 DIAGNOSIS — I25119 Atherosclerotic heart disease of native coronary artery with unspecified angina pectoris: Secondary | ICD-10-CM

## 2019-01-28 LAB — BASIC METABOLIC PANEL
BUN/Creatinine Ratio: 19 (ref 9–20)
BUN: 16 mg/dL (ref 6–24)
CALCIUM: 9.7 mg/dL (ref 8.7–10.2)
CO2: 23 mmol/L (ref 20–29)
Chloride: 101 mmol/L (ref 96–106)
Creatinine, Ser: 0.84 mg/dL (ref 0.76–1.27)
GFR calc Af Amer: 121 mL/min/{1.73_m2} (ref >59)
GFR calc non Af Amer: 104 mL/min/{1.73_m2} (ref >59)
Glucose: 104 mg/dL — ABNORMAL HIGH (ref 65–99)
Potassium: 4.4 mmol/L (ref 3.5–5.2)
Sodium: 137 mmol/L (ref 134–144)

## 2019-01-28 LAB — CBC
Hematocrit: 48.2 % (ref 37.5–51.0)
Hemoglobin: 15.6 g/dL (ref 13.0–17.7)
MCH: 29.2 pg (ref 26.6–33.0)
MCHC: 32.4 g/dL (ref 31.5–35.7)
MCV: 90 fL (ref 79–97)
Platelets: 214 10*3/uL (ref 150–450)
RBC: 5.35 x10E6/uL (ref 4.14–5.80)
RDW: 12 % (ref 11.6–15.4)
WBC: 6.1 10*3/uL (ref 3.4–10.8)

## 2019-01-28 NOTE — Patient Instructions (Signed)
Medication Instructions:  Your physician recommends that you continue on your current medications as directed. Please refer to the Current Medication list given to you today.  If you need a refill on your cardiac medications before your next appointment, please call your pharmacy.   Lab work: BMET, CBC today  If you have labs (blood work) drawn today and your tests are completely normal, you will receive your results only by: Marland Kitchen MyChart Message (if you have MyChart) OR . A paper copy in the mail If you have any lab test that is abnormal or we need to change your treatment, we will call you to review the results.  Testing/Procedures: Your physician has requested that you have a cardiac catheterization. Cardiac catheterization is used to diagnose and/or treat various heart conditions. Doctors may recommend this procedure for a number of different reasons. The most common reason is to evaluate chest pain. Chest pain can be a symptom of coronary artery disease (CAD), and cardiac catheterization can show whether plaque is narrowing or blocking your heart's arteries. This procedure is also used to evaluate the valves, as well as measure the blood flow and oxygen levels in different parts of your heart. For further information please visit HugeFiesta.tn. Please follow instruction sheet, as given.   Follow-Up: At [facility], you and your health needs are our priority.  As part of our continuing mission to provide you with exceptional heart care, we have created designated Provider Care Teams.  These Care Teams include your primary Cardiologist (physician) and Advanced Practice Providers (APPs -  Physician Assistants and Nurse Practitioners) who all work together to provide you with the care you need, when you need it. You will need a follow up appointment in 2-3 weeks.  Please call our office 2 months in advance to schedule this appointment.  You may see Sinclair Grooms, MD or one of the  following Advanced Practice Providers on your designated Care Team:   Truitt Merle, NP Cecilie Kicks, NP . Kathyrn Drown, NP  Any Other Special Instructions Will Be Listed Below (If Applicable).     [place] OFFICE [place], [place] [address] Dept: [phone] Loc: [phone]  Paul Sullivan  01/28/2019  You are scheduled for a Cardiac Catheterization on Monday, March 9 with Dr. Daneen Schick.  1. Please arrive at the [facility] (Main Entrance A) at [facility]: [address] [place], [place] [address] at 7:00 AM (This time is two hours before your procedure to ensure your preparation). Free valet parking service is available.   Special note: Every effort is made to have your procedure done on time. Please understand that emergencies sometimes delay scheduled procedures.  2. Diet: Do not eat solid foods after midnight.  The patient may have clear liquids until 5am upon the day of the procedure.  3. Labs: You will need to have blood drawn today  4. Medication instructions in preparation for your procedure:   Contrast Allergy: No   On the morning of your procedure, take your Aspirin and any morning medicines NOT listed above.  You may use sips of water.  5. Plan for one night stay--bring personal belongings. 6. Bring a current list of your medications and current insurance cards. 7. You MUST have a responsible person to drive you home. 8. Someone MUST be with you the first 24 hours after you arrive home or your discharge  will be delayed. 9. Please wear clothes that are easy to get on and off and wear slip-on shoes.  Thank you for allowing [place] to care for you!   -- [place] [facility]

## 2019-01-31 ENCOUNTER — Telehealth: Payer: Self-pay | Admitting: Interventional Cardiology

## 2019-01-31 NOTE — Telephone Encounter (Signed)
lpmtcb 3/2 

## 2019-01-31 NOTE — Telephone Encounter (Signed)
I spoke to the patient who wanted Dr Tamala Julian to be aware that there is a strong family history of diabetes, according to his mother.  He is scheduled for heart cath 3/9 and just wanted [place] to be aware.

## 2019-01-31 NOTE — Telephone Encounter (Signed)
New Message  PT would like for nurse to return his call. He did not state why

## 2019-01-31 NOTE — Telephone Encounter (Signed)
Follow Up:; ° ° °Returning your call. °

## 2019-02-03 ENCOUNTER — Telehealth: Payer: Self-pay | Admitting: *Deleted

## 2019-02-03 NOTE — Telephone Encounter (Signed)
Encounter error

## 2019-02-03 NOTE — Telephone Encounter (Signed)
Pt contacted pre-catheterization scheduled at [facility] for: Monday February 07, 2019 9 AM Verified arrival time and place: [place] Entrance A at: 7 AM  No solid food after midnight prior to cath, clear liquids until 5 AM day of procedure. Contrast allergy: no  AM meds can be  taken pre-cath with sip of water including: ASA 81 mg  Confirmed patient has responsible person to drive home post procedure and observe 24 hours after arriving home: yes

## 2019-02-07 ENCOUNTER — Other Ambulatory Visit: Payer: Self-pay

## 2019-02-07 ENCOUNTER — Encounter (HOSPITAL_COMMUNITY)
Admission: RE | Disposition: A | Discharge status: Home / Self Care | Payer: Self-pay | Source: Home / Self Care | Attending: Interventional Cardiology

## 2019-02-07 ENCOUNTER — Ambulatory Visit (HOSPITAL_COMMUNITY)
Admission: RE | Admit: 2019-02-07 | Discharge: 2019-02-07 | Disposition: A | Discharge status: Home / Self Care | Payer: BC Managed Care – PPO | Attending: Interventional Cardiology | Admitting: Interventional Cardiology

## 2019-02-07 DIAGNOSIS — E785 Hyperlipidemia, unspecified: Secondary | ICD-10-CM | POA: Diagnosis not present

## 2019-02-07 DIAGNOSIS — Z955 Presence of coronary angioplasty implant and graft: Secondary | ICD-10-CM | POA: Diagnosis not present

## 2019-02-07 DIAGNOSIS — Z79899 Other long term (current) drug therapy: Secondary | ICD-10-CM | POA: Diagnosis not present

## 2019-02-07 DIAGNOSIS — Z7982 Long term (current) use of aspirin: Secondary | ICD-10-CM | POA: Insufficient documentation

## 2019-02-07 DIAGNOSIS — I25119 Atherosclerotic heart disease of native coronary artery with unspecified angina pectoris: Secondary | ICD-10-CM | POA: Insufficient documentation

## 2019-02-07 DIAGNOSIS — I252 Old myocardial infarction: Secondary | ICD-10-CM | POA: Insufficient documentation

## 2019-02-07 HISTORY — PX: INTRAVASCULAR PRESSURE WIRE/FFR STUDY: CATH118243

## 2019-02-07 HISTORY — PX: LEFT HEART CATH AND CORONARY ANGIOGRAPHY: CATH118249

## 2019-02-07 LAB — POCT ACTIVATED CLOTTING TIME: Activated Clotting Time: 356 seconds

## 2019-02-07 SURGERY — LEFT HEART CATH AND CORONARY ANGIOGRAPHY
Anesthesia: LOCAL

## 2019-02-07 MED ORDER — NITROGLYCERIN 1 MG/10 ML FOR IR/CATH LAB
INTRA_ARTERIAL | Status: DC | PRN
  Administered 2019-02-07: 200 ug via INTRACORONARY

## 2019-02-07 MED ORDER — LIDOCAINE HCL (PF) 1 % IJ SOLN
INTRAMUSCULAR | Status: AC
  Filled 2019-02-07: qty 30

## 2019-02-07 MED ORDER — ACETAMINOPHEN 325 MG PO TABS
650.0000 mg | ORAL_TABLET | ORAL | Status: DC | PRN
  Administered 2019-02-07: 650 mg via ORAL
  Filled 2019-02-07: qty 2

## 2019-02-07 MED ORDER — FENTANYL CITRATE (PF) 100 MCG/2ML IJ SOLN
INTRAMUSCULAR | Status: AC
  Filled 2019-02-07: qty 2

## 2019-02-07 MED ORDER — FENTANYL CITRATE (PF) 100 MCG/2ML IJ SOLN
INTRAMUSCULAR | Status: DC | PRN
  Administered 2019-02-07 (×2): 25 ug via INTRAVENOUS

## 2019-02-07 MED ORDER — MIDAZOLAM HCL 2 MG/2ML IJ SOLN
INTRAMUSCULAR | Status: AC
  Filled 2019-02-07: qty 2

## 2019-02-07 MED ORDER — HEPARIN SODIUM (PORCINE) 1000 UNIT/ML IJ SOLN
INTRAMUSCULAR | Status: AC
  Filled 2019-02-07: qty 1

## 2019-02-07 MED ORDER — VERAPAMIL HCL 2.5 MG/ML IV SOLN
INTRAVENOUS | Status: DC | PRN
  Administered 2019-02-07: 10 mL via INTRA_ARTERIAL

## 2019-02-07 MED ORDER — ONDANSETRON HCL 4 MG/2ML IJ SOLN: 4.0000 mg | Freq: Four times a day (QID) | INTRAMUSCULAR | Status: DC | PRN

## 2019-02-07 MED ORDER — MIDAZOLAM HCL 2 MG/2ML IJ SOLN
INTRAMUSCULAR | Status: DC | PRN
  Administered 2019-02-07: 0.5 mg via INTRAVENOUS
  Administered 2019-02-07: 1 mg via INTRAVENOUS

## 2019-02-07 MED ORDER — VERAPAMIL HCL 2.5 MG/ML IV SOLN
INTRAVENOUS | Status: AC
  Filled 2019-02-07: qty 2

## 2019-02-07 MED ORDER — SODIUM CHLORIDE 0.9 % WEIGHT BASED INFUSION: 1.0000 mL/kg/h | INTRAVENOUS | Status: DC

## 2019-02-07 MED ORDER — SODIUM CHLORIDE 0.9% FLUSH: 3.0000 mL | INTRAVENOUS | Status: DC | PRN

## 2019-02-07 MED ORDER — SODIUM CHLORIDE 0.9 % IV SOLN: 250.0000 mL | INTRAVENOUS | Status: DC | PRN

## 2019-02-07 MED ORDER — SODIUM CHLORIDE 0.9% FLUSH: 3.0000 mL | Freq: Two times a day (BID) | INTRAVENOUS | Status: DC

## 2019-02-07 MED ORDER — SODIUM CHLORIDE 0.9 % IV SOLN: INTRAVENOUS | Status: DC

## 2019-02-07 MED ORDER — OXYCODONE HCL 5 MG PO TABS: 5.0000 mg | ORAL_TABLET | ORAL | Status: DC | PRN

## 2019-02-07 MED ORDER — HEPARIN (PORCINE) IN NACL 1000-0.9 UT/500ML-% IV SOLN
INTRAVENOUS | Status: AC
  Filled 2019-02-07: qty 1000

## 2019-02-07 MED ORDER — SODIUM CHLORIDE 0.9 % WEIGHT BASED INFUSION
3.0000 mL/kg/h | INTRAVENOUS | Status: AC
  Administered 2019-02-07: 3 mL/kg/h via INTRAVENOUS

## 2019-02-07 MED ORDER — HEPARIN (PORCINE) IN NACL 1000-0.9 UT/500ML-% IV SOLN
INTRAVENOUS | Status: DC | PRN
  Administered 2019-02-07 (×2): 500 mL

## 2019-02-07 MED ORDER — ASPIRIN 81 MG PO CHEW: 81.0000 mg | CHEWABLE_TABLET | ORAL | Status: DC

## 2019-02-07 MED ORDER — IOHEXOL 350 MG/ML SOLN
INTRAVENOUS | Status: DC | PRN
  Administered 2019-02-07: 130 mL via INTRA_ARTERIAL

## 2019-02-07 MED ORDER — HEPARIN SODIUM (PORCINE) 1000 UNIT/ML IJ SOLN
INTRAMUSCULAR | Status: DC | PRN
  Administered 2019-02-07: 6000 [IU] via INTRAVENOUS
  Administered 2019-02-07: 4500 [IU] via INTRAVENOUS

## 2019-02-07 MED ORDER — ASPIRIN 81 MG PO CHEW: 81.0000 mg | CHEWABLE_TABLET | Freq: Every day | ORAL | Status: DC

## 2019-02-07 MED ORDER — NITROGLYCERIN 1 MG/10 ML FOR IR/CATH LAB
INTRA_ARTERIAL | Status: AC
  Filled 2019-02-07: qty 10

## 2019-02-07 MED ORDER — LIDOCAINE HCL (PF) 1 % IJ SOLN
INTRAMUSCULAR | Status: DC | PRN
  Administered 2019-02-07: 2 mL

## 2019-02-07 SURGICAL SUPPLY — 13 items
CATH 5FR JL3.5 JR4 ANG PIG MP (CATHETERS) ×2 IMPLANT
CATH VISTA GUIDE 6FR XBLAD3.5 (CATHETERS) ×2 IMPLANT
DEVICE RAD COMP TR BAND LRG (VASCULAR PRODUCTS) ×2 IMPLANT
GLIDESHEATH SLEND A-KIT 6F 22G (SHEATH) ×2 IMPLANT
GUIDEWIRE INQWIRE 1.5J.035X260 (WIRE) ×1 IMPLANT
GUIDEWIRE PRESSURE COMET II (WIRE) ×4 IMPLANT
INQWIRE 1.5J .035X260CM (WIRE) ×2
KIT ESSENTIALS PG (KITS) ×2 IMPLANT
KIT HEART LEFT (KITS) ×2 IMPLANT
PACK CARDIAC CATHETERIZATION (CUSTOM PROCEDURE TRAY) ×2 IMPLANT
SHEATH PROBE COVER 6X72 (BAG) ×2 IMPLANT
TRANSDUCER W/STOPCOCK (MISCELLANEOUS) ×2 IMPLANT
TUBING CIL FLEX 10 FLL-RA (TUBING) ×2 IMPLANT

## 2019-02-07 NOTE — CV Procedure (Signed)
   Diagnostic angiography via right radial with ultrasound guidance for access.  Indication: Recurring atypical chest discomfort with suggestion of apical ischemia by nuclear scintigraphy in a patient with known moderate proximal LAD disease.  Right coronary stent is widely patent  Circumflex is widely patent  Left main is normal  LAD proximal 60 to 70% eccentric stenosis  DFR LAD 0.92  Plan continue medical therapy.

## 2019-02-07 NOTE — Discharge Instructions (Signed)
Radial Site Care ° °This sheet gives you information about how to care for yourself after your procedure. Your health care provider may also give you more specific instructions. If you have problems or questions, contact your health care provider. °What can I expect after the procedure? °After the procedure, it is common to have: °· Bruising and tenderness at the catheter insertion area. °Follow these instructions at home: °Medicines °· Take over-the-counter and prescription medicines only as told by your health care provider. °Insertion site care °· Follow instructions from your health care provider about how to take care of your insertion site. Make sure you: °? Wash your hands with soap and water before you change your bandage (dressing). If soap and water are not available, use hand sanitizer. °? Change your dressing as told by your health care provider. °? Leave stitches (sutures), skin glue, or adhesive strips in place. These skin closures may need to stay in place for 2 weeks or longer. If adhesive strip edges start to loosen and curl up, you may trim the loose edges. Do not remove adhesive strips completely unless your health care provider tells you to do that. °· Check your insertion site every day for signs of infection. Check for: °? Redness, swelling, or pain. °? Fluid or blood. °? Pus or a bad smell. °? Warmth. °· Do not take baths, swim, or use a hot tub until your health care provider approves. °· You may shower 24-48 hours after the procedure, or as directed by your health care provider. °? Remove the dressing and gently wash the site with plain soap and water. °? Pat the area dry with a clean towel. °? Do not rub the site. That could cause bleeding. °· Do not apply powder or lotion to the site. °Activity ° °· For 24 hours after the procedure, or as directed by your health care provider: °? Do not flex or bend the affected arm. °? Do not push or pull heavy objects with the affected arm. °? Do not  drive yourself home from the hospital or clinic. You may drive 24 hours after the procedure unless your health care provider tells you not to. °? Do not operate machinery or power tools. °· Do not lift anything that is heavier than 10 lb (4.5 kg), or the limit that you are told, until your health care provider says that it is safe. °· Ask your health care provider when it is okay to: °? Return to work or school. °? Resume usual physical activities or sports. °? Resume sexual activity. °General instructions °· If the catheter site starts to bleed, raise your arm and put firm pressure on the site. If the bleeding does not stop, get help right away. This is a medical emergency. °· If you went home on the same day as your procedure, a responsible adult should be with you for the first 24 hours after you arrive home. °· Keep all follow-up visits as told by your health care provider. This is important. °Contact a health care provider if: °· You have a fever. °· You have redness, swelling, or yellow drainage around your insertion site. °Get help right away if: °· You have unusual pain at the radial site. °· The catheter insertion area swells very fast. °· The insertion area is bleeding, and the bleeding does not stop when you hold steady pressure on the area. °· Your arm or hand becomes pale, cool, tingly, or numb. °These symptoms may represent a serious problem   that is an emergency. Do not wait to see if the symptoms will go away. Get medical help right away. Call your local emergency services (911 in the [place] Do not drive yourself to the hospital. °Summary °· After the procedure, it is common to have bruising and tenderness at the site. °· Follow instructions from your health care provider about how to take care of your radial site wound. Check the wound every day for signs of infection. °· Do not lift anything that is heavier than 10 lb (4.5 kg), or the limit that you are told, until your health care provider says  that it is safe. °This information is not intended to replace advice given to you by your health care provider. Make sure you discuss any questions you have with your health care provider. °Document Released: 12/20/2010 Document Revised: 12/23/2017 Document Reviewed: 12/23/2017 °Elsevier Interactive Patient Education © 2019 [employer] ° °

## 2019-02-07 NOTE — Interval H&P Note (Signed)
Cath Lab Visit (complete for each Cath Lab visit)  Clinical Evaluation Leading to the Procedure:   ACS: No.  Non-ACS:    Anginal Classification: CCS Sullivan  Anti-ischemic medical therapy: Minimal Therapy (1 class of medications)  Non-Invasive Test Results: No non-invasive testing performed  Prior CABG: No previous CABG      History and Physical Interval Note:  02/07/2019 9:10 AM  Paul Sullivan  has presented today for surgery, with the diagnosis of Unstable Angina.  The various methods of treatment have been discussed with the patient and family. After consideration of risks, benefits and other options for treatment, the patient has consented to  Procedure(s): LEFT HEART CATH AND CORONARY ANGIOGRAPHY (N/A) as a surgical intervention.  The patient's history has been reviewed, patient examined, no change in status, stable for surgery.  I have reviewed the patient's chart and labs.  Questions were answered to the patient's satisfaction.     Paul Sullivan

## 2019-02-08 ENCOUNTER — Encounter (HOSPITAL_COMMUNITY): Payer: Self-pay | Admitting: Interventional Cardiology

## 2019-02-11 ENCOUNTER — Encounter: Payer: Self-pay | Admitting: Nurse Practitioner

## 2019-02-22 NOTE — Progress Notes (Signed)
Virtual Visit via Video Note    Evaluation Performed:  Follow-up visit  This visit type was conducted due to national recommendations for restrictions regarding the COVID-19 Pandemic (e.g. social distancing).  This format is felt to be most appropriate for this patient at this time.  All issues noted in this document were discussed and addressed.  No physical exam was performed (except for noted visual exam findings with Video Visits).  Please refer to the patient's chart (MyChart message for video visits and phone note for telephone visits) for the patient's consent to telehealth for [facility].  Date:  02/23/2019   ID:  Paul Sullivan, DOB 01/02/1971, MRN [id number]  Patient Location: Patient's Home [place] [id number]   Provider location:   [address] [place], [place]  [address]  PCP:  Chesley Noon, MD  Cardiologist:  Sinclair Grooms, MD  Electrophysiologist:  None   Chief Complaint:  Post cath visit.   History of Present Illness:    Paul Sullivan is a 48 y.o. male who presents via audio/video conferencing for a telehealth visit today.  He is seen for Dr. Tamala Julian.   The patient does not symptoms concerning for COVID-19 infection (fever, chills, cough, or new SHORTNESS OF BREATH).   He has a history of known CAD with prior history of CAD with prior inferior infarct 01/2016 with right coronary DES & residual 60-80% mid LAD with negative Cardiolite in March of 2017 and hyperlipidemia.   Last seen by Dr. Tamala Julian in February - endorsed chest tightness. Myoview was obtained and was abnormal - thus referred for cardiac catheterization.  Cardiac cath was performed earlier this month - he is to continue with medical management and aggressive CV risk factor modification. He is on statin therapy.    Today - he is seen via virtual visit. He is doing well. Lots of stress with his job and trying to move to virtual visits with  RCC. No more chest pain. Not short of breath. His cath site is ok. He has some bruising with an IV site - this is improved. He is tolerating his medicines. He has no allergies. He has no cardiac concerns. He notes his father died back at Christmas - he wonders if this was the trigger for the recent events.    Prior CV studies:   The following studies were reviewed today:  Left Heart Cath 02/07/2019 INTRAVASCULAR PRESSURE WIRE/FFR STUDY  LEFT HEART CATH AND CORONARY ANGIOGRAPHY  Conclusion    Dominant right coronary artery.  Mid RCA with widely patent stent.  Diffuse luminal irregularities mid to distal RCA.  Left main is widely patent.  Eccentric 60 to 75% proximal LAD, unchanged from angiogram 2017.  DFR 0.92 performed today.  LAD in the apical segment contains 50% tandem stenoses.  Ostial 20% circumflex.  Obtuse marginals and remainder of circumflex is widely patent.  Normal left ventricular size.  LVEDP 23 mmHg.  EF 55%.  RECOMMENDATIONS:   Continue aggressive secondary risk factor modification.   Myoview Study Highlights 01/2019    Nuclear stress EF: 54%.  The left ventricular ejection fraction is mildly decreased (45-54%).  Blood pressure demonstrated a normal response to exercise.  Upsloping ST segment depression ST segment depression of 1 mm was noted during stress in the II, Sullivan and V5 leads, beginning at 8 minutes of stress, and returning to baseline after less than 1 minute of recovery.  Defect  1: There is a small defect of moderate severity present in the mid inferior, apical inferior and apex location.  Findings consistent with prior myocardial infarction.  This is a low risk study.   Small, moderate, fixed defect in the apex, infero apex and mid inferior wall. No ischemia on ECG.     Past Medical History:  Diagnosis Date  . CAD (coronary artery disease)    a. Inf STEMI 3/17 - LHC pLAD 80, dLAD 50/60, oLCx 30, mRCA 100, 50, Normal LVF >> PCI - DES to  RCA;  b. OP nuc stress study to assess LAD territory ischemia  . Dyslipidemia, goal LDL below 70 02/07/2016   Past Surgical History:  Procedure Laterality Date  . CARDIAC CATHETERIZATION N/A 02/05/2016   Procedure: Left Heart Cath and Coronary Angiography;  Surgeon: Belva Crome, MD;  Location: [place] [facility];  Service: Cardiovascular;  Laterality: N/A;  . INTRAVASCULAR PRESSURE WIRE/FFR STUDY N/A 02/07/2019   Procedure: INTRAVASCULAR PRESSURE WIRE/FFR STUDY;  Surgeon: Belva Crome, MD;  Location: [place] [facility];  Service: Cardiovascular;  Laterality: N/A;  . LEFT HEART CATH AND CORONARY ANGIOGRAPHY N/A 02/07/2019   Procedure: LEFT HEART CATH AND CORONARY ANGIOGRAPHY;  Surgeon: Belva Crome, MD;  Location: [place] [facility];  Service: Cardiovascular;  Laterality: N/A;     Current Meds  Medication Sig  . aspirin EC 81 MG tablet Take 81 mg by mouth daily.  Marland Kitchen atorvastatin (LIPITOR) 80 MG tablet Take 1 tablet (80 mg total) by mouth daily at 6 PM. (Patient taking differently: Take 80 mg by mouth daily. )  . metoprolol succinate (TOPROL-XL) 25 MG 24 hr tablet Take 1 tablet (25 mg total) by mouth daily.     Allergies:   Patient has no known allergies.   Social History   Tobacco Use  . Smoking status: Never Smoker  . Smokeless tobacco: Never Used  Substance Use Topics  . Alcohol use: No    Alcohol/week: 0.0 standard drinks  . Drug use: No     Family Hx: The patient's family history includes Coronary artery disease in his mother; Heart failure in his maternal uncle; Hyperlipidemia in his father and mother; Hypertension in his father and mother.  ROS:   Please see the history of present illness.     All other systems reviewed and are negative.   Labs/Other Tests and Data Reviewed:    Recent Labs: 09/27/2018: ALT 45 01/28/2019: BUN 16; Creatinine, Ser 0.84; Hemoglobin 15.6; Platelets 214; Potassium 4.4; Sodium 137   Recent Lipid Panel Lab Results  Component Value Date/Time    CHOL 113 09/27/2018 04:47 PM   TRIG 102 09/27/2018 04:47 PM   HDL 35 (L) 09/27/2018 04:47 PM   CHOLHDL 3.2 09/27/2018 04:47 PM   CHOLHDL 2.9 07/31/2017 08:52 AM   LDLCALC 58 09/27/2018 04:47 PM      Wt Readings from Last 3 Encounters:  02/07/19 195 lb (88.5 kg)  01/28/19 199 lb 6.4 oz (90.4 kg)  01/20/19 196 lb (88.9 kg)     Exam:    Vital Signs:  There were no vitals taken for this visit. He does not have a way to check his BP.  Well nourished, well developed male in no acute distress. He is appropriate.   He notes his cath site is healed - right wrist.    ASSESSMENT & PLAN:    1.  Chest pain - s/p recent cath with reassuring findings - he is to continue  with medical management and aggressive CV risk factor modification. He is doing well - has no concerns. Exercise encouraged - especially to help with the stress factor that he is experiencing with the current pandemic.   2. HLD - on statin - LDL at goal  3. COVID-19 Education: The signs and symptoms of COVID-19 were discussed with the patient and how to seek care for testing (follow up with PCP or arrange E-visit).  The importance of social distancing was discussed today.  Patient Risk:   After full review of this patient's clinical status, I feel that they are at least moderate risk at this time.  Time:   Today, I have spent 15 minutes with the patient with telehealth technology discussing his overall health status and recent cath along with current pandemic issues.      Medication Adjustments/Labs and Tests Ordered: Current medicines are reviewed at length with the patient today.  He has no concerns. He has available supply.    Tests Ordered: No orders of the defined types were placed in this encounter.  Medication Changes: No orders of the defined types were placed in this encounter.   Disposition:  He is to see Dr. Tamala Julian back in October. We will be available in the interim if problems arise.   Amie Critchley, NP  02/23/2019 9:01 AM    [place]

## 2019-02-23 ENCOUNTER — Telehealth (INDEPENDENT_AMBULATORY_CARE_PROVIDER_SITE_OTHER): Payer: BC Managed Care – PPO | Admitting: Nurse Practitioner

## 2019-02-23 ENCOUNTER — Other Ambulatory Visit: Payer: Self-pay

## 2019-02-23 DIAGNOSIS — E785 Hyperlipidemia, unspecified: Secondary | ICD-10-CM

## 2019-02-23 DIAGNOSIS — I25119 Atherosclerotic heart disease of native coronary artery with unspecified angina pectoris: Secondary | ICD-10-CM

## 2019-02-23 DIAGNOSIS — I252 Old myocardial infarction: Secondary | ICD-10-CM

## 2019-02-23 NOTE — Patient Instructions (Signed)
  Medication Instructions:    Continue with your current medicines.    If you need a refill on your cardiac medications before your next appointment, please call your pharmacy.      Follow-Up:   See Dr. Tamala Julian in October - you will receive a phone call or letter 2 months prior to arrange. I am happy to see you back if needed.     At [facility], you and your health needs are our priority.  As part of our continuing mission to provide you with exceptional heart care, we have created designated Provider Care Teams.  These Care Teams include your primary Cardiologist (physician) and Advanced Practice Providers (APPs -  Physician Assistants and Nurse Practitioners) who all work together to provide you with the care you need, when you need it.  Special Instructions:  . Continue social distancing . Regular exercise encouraged.   Call the [place] office at [phone] if you have any questions, problems or concerns.

## 2019-09-30 ENCOUNTER — Other Ambulatory Visit: Payer: Self-pay

## 2019-09-30 DIAGNOSIS — Z20822 Contact with and (suspected) exposure to covid-19: Secondary | ICD-10-CM

## 2019-10-01 LAB — NOVEL CORONAVIRUS, NAA: SARS-CoV-2, NAA: NOT DETECTED

## 2019-10-24 ENCOUNTER — Other Ambulatory Visit: Payer: Self-pay | Admitting: Interventional Cardiology

## 2019-10-24 MED ORDER — ATORVASTATIN CALCIUM 80 MG PO TABS: 80.0000 mg | ORAL_TABLET | Freq: Every day | ORAL | 0 refills | Status: DC

## 2019-10-24 MED ORDER — METOPROLOL SUCCINATE ER 25 MG PO TB24: 25.0000 mg | ORAL_TABLET | Freq: Every day | ORAL | 0 refills | Status: DC

## 2019-11-21 ENCOUNTER — Ambulatory Visit: Payer: BC Managed Care – PPO | Attending: Internal Medicine

## 2019-11-21 ENCOUNTER — Other Ambulatory Visit: Payer: Self-pay

## 2019-11-21 DIAGNOSIS — Z20822 Contact with and (suspected) exposure to covid-19: Secondary | ICD-10-CM

## 2019-11-22 LAB — NOVEL CORONAVIRUS, NAA: SARS-CoV-2, NAA: NOT DETECTED

## 2020-01-23 ENCOUNTER — Other Ambulatory Visit: Payer: Self-pay

## 2020-01-23 MED ORDER — ATORVASTATIN CALCIUM 80 MG PO TABS: 80.0000 mg | ORAL_TABLET | Freq: Every day | ORAL | 0 refills | Status: DC

## 2020-01-23 MED ORDER — METOPROLOL SUCCINATE ER 25 MG PO TB24: 25.0000 mg | ORAL_TABLET | Freq: Every day | ORAL | 0 refills | Status: DC

## 2020-02-13 NOTE — Progress Notes (Signed)
Virtual Visit via Video Note   This visit type was conducted due to national recommendations for restrictions regarding the COVID-19 Pandemic (e.g. social distancing) in an effort to limit this patient's exposure and mitigate transmission in our community.  Due to his co-morbid illnesses, this patient is at least at moderate risk for complications without adequate follow up.  This format is felt to be most appropriate for this patient at this time.  All issues noted in this document were discussed and addressed.  A limited physical exam was performed with this format.  Please refer to the patient's chart for his consent to telehealth for [facility].   The patient was identified using 2 identifiers.  Date:  02/15/2020   ID:  Paul Sullivan, DOB June 19, 1971, MRN [id number]  Patient Location: Other:  work  Provider Location: Home  PCP:  Chesley Noon, MD  Cardiologist:  Sinclair Grooms, MD  Electrophysiologist:  None   Evaluation Performed:  Follow-Up Visit  Chief Complaint: Follow-up, seen by Dr. Tamala Julian  History of Present Illness:    Paul Sullivan is a 49 y.o. 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   Last seen by Dr. Tamala Julian in February 2020 at which time he reported chest tightness.  Given his symptoms, Myoview was obtained and was abnormal therefore, he was referred for cardiac catheterization performed 02/07/2019 which showed m RCA widely patent stent with diffuse luminal irregularities in the mid to distal RCA.  Eccentric 60 to 75% proximal LAD, unchanged from prior angiogram in 2017.  DFR with 0.92.  Ostial LCx with 20% stenosis with obtuse marginals and remainder of LCx noted to be widely patent.  EF was reported at 55%.  Aggressive CV risk factor modification and medical management was recommended at  that time.  He was last seen by Truitt Merle, NP 02/23/2019 for telehealth visit at which time he was noted to be doing well.  Was under more stress with his job and recent loss of his father however had no recurrent chest pain symptoms.  The patient does not have symptoms concerning for COVID-19 infection (fever, chills, cough, or new shortness of breath).    Past Medical History:  Diagnosis Date  . CAD (coronary artery disease)    a. Inf STEMI 3/17 - LHC pLAD 80, dLAD 50/60, oLCx 30, mRCA 100, 50, Normal LVF >> PCI - DES to RCA;  b. OP nuc stress study to assess LAD territory ischemia  . Dyslipidemia, goal LDL below 70 02/07/2016   Past Surgical History:  Procedure Laterality Date  . CARDIAC CATHETERIZATION N/A 02/05/2016   Procedure: Left Heart Cath and Coronary Angiography;  Surgeon: Belva Crome, MD;  Location: [place] [facility];  Service: Cardiovascular;  Laterality: N/A;  . INTRAVASCULAR PRESSURE WIRE/FFR STUDY N/A 02/07/2019   Procedure: INTRAVASCULAR PRESSURE WIRE/FFR STUDY;  Surgeon: Belva Crome, MD;  Location: [place] [facility];  Service: Cardiovascular;  Laterality: N/A;  . LEFT HEART CATH AND CORONARY ANGIOGRAPHY N/A 02/07/2019   Procedure: LEFT HEART CATH AND CORONARY ANGIOGRAPHY;  Surgeon: Belva Crome, MD;  Location: [place] [facility];  Service: Cardiovascular;  Laterality: N/A;     Current Meds  Medication Sig  . acetaminophen (TYLENOL) 500 MG tablet Take 1,000 mg by mouth every 6 (six) hours as needed (  for pain.).  Marland Kitchen aspirin EC 81 MG tablet Take 81 mg by mouth daily.  Marland Kitchen atorvastatin (LIPITOR) 80 MG tablet Take 1 tablet (80 mg total) by mouth daily at 6 PM.  . metoprolol succinate (TOPROL-XL) 25 MG 24 hr tablet Take 1 tablet (25 mg total) by mouth daily.  . nitroGLYCERIN (NITROSTAT) 0.4 MG SL tablet Place 1 tablet (0.4 mg total) under the tongue every 5 (five) minutes x 3 doses as needed for chest pain (Max 3rd dose call 911).     Allergies:   Patient has no known  allergies.   Social History   Tobacco Use  . Smoking status: Never Smoker  . Smokeless tobacco: Never Used  Substance Use Topics  . Alcohol use: No    Alcohol/week: 0.0 standard drinks  . Drug use: No     Family Hx: The patient's family history includes Coronary artery disease in his mother; Heart failure in his maternal uncle; Hyperlipidemia in his father and mother; Hypertension in his father and mother.  ROS:   Please see the history of present illness.     All other systems reviewed and are negative.  Prior CV studies:   The following studies were reviewed today:  Left Heart Cath 02/07/2019 INTRAVASCULAR PRESSURE WIRE/FFR STUDY  LEFT HEART CATH AND CORONARY ANGIOGRAPHY  Conclusion    Dominant right coronary artery.  Mid RCA with widely patent stent. Diffuse luminal irregularities mid to distal RCA.  Left main is widely patent.  Eccentric 60 to 75% proximal LAD, unchanged from angiogram 2017. DFR 0.92 performed today. LAD in the apical segment contains 50% tandem stenoses.  Ostial 20% circumflex. Obtuse marginals and remainder of circumflex is widely patent.  Normal left ventricular size. LVEDP 23 mmHg. EF 55%.  RECOMMENDATIONS:   Continue aggressive secondary risk factor modification.   Myoview Study Highlights 01/2019    Nuclear stress EF: 54%.  The left ventricular ejection fraction is mildly decreased (45-54%).  Blood pressure demonstrated a normal response to exercise.  Upsloping ST segment depression ST segment depression of 1 mm was noted during stress in the II, Sullivan and V5 leads, beginning at 8 minutes of stress, and returning to baseline after less than 1 minute of recovery.  Defect 1: There is a small defect of moderate severity present in the mid inferior, apical inferior and apex location.  Findings consistent with prior myocardial infarction.  This is a low risk study.  Small, moderate, fixed defect in the apex, infero apex  and mid inferior wall. No ischemia on ECG.    Labs/Other Tests and Data Reviewed:    EKG:  No ECG reviewed.  Recent Labs: No results found for requested labs within last 8760 hours.   Recent Lipid Panel Lab Results  Component Value Date/Time   CHOL 113 09/27/2018 04:47 PM   TRIG 102 09/27/2018 04:47 PM   HDL 35 (L) 09/27/2018 04:47 PM   CHOLHDL 3.2 09/27/2018 04:47 PM   CHOLHDL 2.9 07/31/2017 08:52 AM   LDLCALC 58 09/27/2018 04:47 PM    Wt Readings from Last 3 Encounters:  02/15/20 210 lb (95.3 kg)  02/07/19 195 lb (88.5 kg)  01/28/19 199 lb 6.4 oz (90.4 kg)     Objective:    Vital Signs:  Ht 6\' 2"  (1.88 m)   Wt 210 lb (95.3 kg)   BMI 26.96 kg/m    VITAL SIGNS:  reviewed GEN:  no acute distress RESPIRATORY:  normal respiratory effort, symmetric expansion NEURO:  alert and  oriented x 3, no obvious focal deficit PSYCH:  normal affect  ASSESSMENT & PLAN:    1.  CAD without angina: -Recent cardiac cath 01/2019 with reassuring findings with plans to continue with aggressive CV risk modification and medical management -Denies recurrent symptoms -Continue current regimen  2.  HLD: -Last LDL, 58 on 09/27/2018 -Continue statin -Repeat fasting lipid panel, LFTs today along with BMET, CBC   COVID-19 Education: The signs and symptoms of COVID-19 were discussed with the patient and how to seek care for testing (follow up with PCP or arrange E-visit).  The importance of social distancing was discussed today.  Time:   Today, I have spent 15 minutes with the patient with telehealth technology discussing the above problems.     Medication Adjustments/Labs and Tests Ordered: Current medicines are reviewed at length with the patient today.  Concerns regarding medicines are outlined above.   Tests Ordered: No orders of the defined types were placed in this encounter.   Medication Changes: No orders of the defined types were placed in this encounter.   Follow Up:   Either In Person or Virtual Dr. Tamala Julian in 1 year   Signed, Kathyrn Drown, NP  02/15/2020 9:57 AM    [place]

## 2020-02-15 ENCOUNTER — Other Ambulatory Visit: Payer: Self-pay

## 2020-02-15 ENCOUNTER — Encounter: Payer: Self-pay | Admitting: Cardiology

## 2020-02-15 ENCOUNTER — Telehealth (INDEPENDENT_AMBULATORY_CARE_PROVIDER_SITE_OTHER): Payer: BC Managed Care – PPO | Admitting: Cardiology

## 2020-02-15 VITALS — Ht 74.0 in | Wt 210.0 lb

## 2020-02-15 DIAGNOSIS — E785 Hyperlipidemia, unspecified: Secondary | ICD-10-CM | POA: Diagnosis not present

## 2020-02-15 DIAGNOSIS — I25119 Atherosclerotic heart disease of native coronary artery with unspecified angina pectoris: Secondary | ICD-10-CM

## 2020-02-15 DIAGNOSIS — I2510 Atherosclerotic heart disease of native coronary artery without angina pectoris: Secondary | ICD-10-CM | POA: Diagnosis not present

## 2020-02-15 NOTE — Addendum Note (Signed)
Addended by: Mady Haagensen on: 02/15/2020 10:12 AM   Modules accepted: Orders

## 2020-02-15 NOTE — Patient Instructions (Signed)
Medication Instructions:   Your physician recommends that you continue on your current medications as directed. Please refer to the Current Medication list given to you today.  *If you need a refill on your cardiac medications before your next appointment, please call your pharmacy*  Lab Work:  Your physician recommends that you return for lab work for fasting lipid panel, LFT, BMET, and CBC. Patient will have labs drawn at [facility].  If you have labs (blood work) drawn today and your tests are completely normal, you will receive your results only by: Marland Kitchen MyChart Message (if you have MyChart) OR . A paper copy in the mail If you have any lab test that is abnormal or we need to change your treatment, we will call you to review the results.  Testing/Procedures:  None ordered today  Follow-Up: At [facility], you and your health needs are our priority.  As part of our continuing mission to provide you with exceptional heart care, we have created designated Provider Care Teams.  These Care Teams include your primary Cardiologist (physician) and Advanced Practice Providers (APPs -  Physician Assistants and Nurse Practitioners) who all work together to provide you with the care you need, when you need it.  We recommend signing up for the patient portal called "MyChart".  Sign up information is provided on this After Visit Summary.  MyChart is used to connect with patients for Virtual Visits (Telemedicine).  Patients are able to view lab/test results, encounter notes, upcoming appointments, etc.  Non-urgent messages can be sent to your provider as well.   To learn more about what you can do with MyChart, go to [email or web address].    Your next appointment:   12 month(s)  The format for your next appointment:   In Person  Provider:   Daneen Schick, MD

## 2020-04-03 ENCOUNTER — Encounter: Payer: Self-pay | Admitting: Internal Medicine

## 2020-04-23 ENCOUNTER — Other Ambulatory Visit: Payer: Self-pay

## 2020-04-23 MED ORDER — ATORVASTATIN CALCIUM 80 MG PO TABS: 80.0000 mg | ORAL_TABLET | Freq: Every day | ORAL | 2 refills | Status: DC

## 2020-04-23 MED ORDER — METOPROLOL SUCCINATE ER 25 MG PO TB24: 25.0000 mg | ORAL_TABLET | Freq: Every day | ORAL | 2 refills | Status: DC

## 2020-05-08 ENCOUNTER — Encounter: Payer: BC Managed Care – PPO | Admitting: Gastroenterology

## 2020-06-01 ENCOUNTER — Other Ambulatory Visit: Payer: Self-pay

## 2020-06-01 ENCOUNTER — Ambulatory Visit (AMBULATORY_SURGERY_CENTER): Payer: Self-pay | Admitting: *Deleted

## 2020-06-01 VITALS — Ht 73.25 in | Wt 204.0 lb

## 2020-06-01 DIAGNOSIS — Z1211 Encounter for screening for malignant neoplasm of colon: Secondary | ICD-10-CM

## 2020-06-01 DIAGNOSIS — Z800 Family history of malignant neoplasm of digestive organs: Secondary | ICD-10-CM

## 2020-06-01 MED ORDER — SUTAB 1479-225-188 MG PO TABS: 1.0000 | ORAL_TABLET | ORAL | 0 refills | Status: DC

## 2020-06-01 NOTE — Progress Notes (Signed)
Patient is here in-person for PV. Patient denies any allergies to eggs or soy. Patient denies any problems with anesthesia/sedation. Patient denies any oxygen use at home. Patient denies taking any diet/weight loss medications or blood thinners. Patient is not being treated for MRSA or C-diff. Patient is aware of our care-partner policy and HIDUP-73 safety protocol. EMMI education assisgned to the patient for the procedure, this was explained and instructions given to patient.   COVID-19 vaccines completed April 2021 per pt. Prep Prescription coupon given to the patient.

## 2020-06-07 ENCOUNTER — Telehealth: Payer: Self-pay | Admitting: Internal Medicine

## 2020-06-07 NOTE — Telephone Encounter (Signed)
I called pt's pharmacy- they ran the couple in the RX and dropped the price to $40.    I left message on pt's machine regarding the cost and that RX will be ready for pick up tomorrow because they need to order it.  I asked him to call office back if he has any further questions or needs

## 2020-06-08 ENCOUNTER — Encounter: Payer: Self-pay | Admitting: Internal Medicine

## 2020-06-15 ENCOUNTER — Other Ambulatory Visit: Payer: Self-pay

## 2020-06-15 ENCOUNTER — Encounter: Payer: Self-pay | Admitting: Internal Medicine

## 2020-06-15 ENCOUNTER — Ambulatory Visit (AMBULATORY_SURGERY_CENTER): Payer: BC Managed Care – PPO | Admitting: Internal Medicine

## 2020-06-15 VITALS — BP 130/78 | HR 70 | Temp 97.8°F | Resp 16 | Ht 73.25 in | Wt 204.0 lb

## 2020-06-15 DIAGNOSIS — Z1211 Encounter for screening for malignant neoplasm of colon: Secondary | ICD-10-CM | POA: Diagnosis not present

## 2020-06-15 DIAGNOSIS — K635 Polyp of colon: Secondary | ICD-10-CM | POA: Diagnosis not present

## 2020-06-15 DIAGNOSIS — Z800 Family history of malignant neoplasm of digestive organs: Secondary | ICD-10-CM

## 2020-06-15 DIAGNOSIS — R238 Other skin changes: Secondary | ICD-10-CM

## 2020-06-15 DIAGNOSIS — D122 Benign neoplasm of ascending colon: Secondary | ICD-10-CM

## 2020-06-15 MED ORDER — SODIUM CHLORIDE 0.9 % IV SOLN: 500.0000 mL | Freq: Once | INTRAVENOUS | Status: DC

## 2020-06-15 NOTE — Progress Notes (Signed)
Called to room to assist during endoscopic procedure.  Patient ID and intended procedure confirmed with present staff. Received instructions for my participation in the procedure from the performing physician.  

## 2020-06-15 NOTE — Patient Instructions (Signed)
Handouts given:  Hemorrhoids, Diverticulosis, polyps Resume previous diet Continue present medications Repeat colonoscopy in 5 years  YOU HAD AN ENDOSCOPIC PROCEDURE TODAY AT [facility]:   Refer to the procedure report that was given to you for any specific questions about what was found during the examination.  If the procedure report does not answer your questions, please call your gastroenterologist to clarify.  If you requested that your care partner not be given the details of your procedure findings, then the procedure report has been included in a sealed envelope for you to review at your convenience later.  YOU SHOULD EXPECT: Some feelings of bloating in the abdomen. Passage of more gas than usual.  Walking can help get rid of the air that was put into your GI tract during the procedure and reduce the bloating. If you had a lower endoscopy (such as a colonoscopy or flexible sigmoidoscopy) you may notice spotting of blood in your stool or on the toilet paper. If you underwent a bowel prep for your procedure, you may not have a normal bowel movement for a few days.  Please Note:  You might notice some irritation and congestion in your nose or some drainage.  This is from the oxygen used during your procedure.  There is no need for concern and it should clear up in a day or so.  SYMPTOMS TO REPORT IMMEDIATELY:   Following lower endoscopy (colonoscopy or flexible sigmoidoscopy):  Excessive amounts of blood in the stool  Significant tenderness or worsening of abdominal pains  Swelling of the abdomen that is new, acute  Fever of 100F or higher   For urgent or emergent issues, a gastroenterologist can be reached at any hour by calling [phone]. Do not use MyChart messaging for urgent concerns.    DIET:  We do recommend a small meal at first, but then you may proceed to your regular diet.  Drink plenty of fluids but you should avoid alcoholic beverages for 24  hours.  ACTIVITY:  You should plan to take it easy for the rest of today and you should NOT DRIVE or use heavy machinery until tomorrow (because of the sedation medicines used during the test).    FOLLOW UP: Our staff will call the number listed on your records 48-72 hours following your procedure to check on you and address any questions or concerns that you may have regarding the information given to you following your procedure. If we do not reach you, we will leave a message.  We will attempt to reach you two times.  During this call, we will ask if you have developed any symptoms of COVID 19. If you develop any symptoms (ie: fever, flu-like symptoms, shortness of breath, cough etc.) before then, please call [phone].  If you test positive for Covid 19 in the 2 weeks post procedure, please call and report this information to [place].    If any biopsies were taken you will be contacted by phone or by letter within the next 1-3 weeks.  Please call us at [phone] if you have not heard about the biopsies in 3 weeks.    SIGNATURES/CONFIDENTIALITY: You and/or your care partner have signed paperwork which will be entered into your electronic medical record.  These signatures attest to the fact that that the information above on your After Visit Summary has been reviewed and is understood.  Full responsibility of the confidentiality of this discharge information lies with you and/or your care-partner.

## 2020-06-15 NOTE — Progress Notes (Signed)
Pt's states no medical or surgical changes since previsit or office visit.  VS CW  

## 2020-06-15 NOTE — Progress Notes (Signed)
A and O x3. Report to RN. Tolerated MAC anesthesia well.

## 2020-06-15 NOTE — Op Note (Signed)
[facility] Patient Name: Paul Sullivan Procedure Date: 06/15/2020 8:48 AM MRN: [id number] Endoscopist: Jerene Bears , MD Age: 49 Referring MD:  Date of Birth: 11-04-1971 Gender: Male Account #: [id number] Procedure:                Colonoscopy Indications:              Screening in patient at increased risk: Family                            history of 1st-degree relative (father) with                            colorectal cancer, This is the patient's first                            colonoscopy Medicines:                Monitored Anesthesia Care Procedure:                Pre-Anesthesia Assessment:                           - Prior to the procedure, a History and Physical                            was performed, and patient medications and                            allergies were reviewed. The patient's tolerance of                            previous anesthesia was also reviewed. The risks                            and benefits of the procedure and the sedation                            options and risks were discussed with the patient.                            All questions were answered, and informed consent                            was obtained. Prior Anticoagulants: The patient has                            taken no previous anticoagulant or antiplatelet                            agents. ASA Grade Assessment: II - A patient with                            mild systemic disease. After reviewing the risks  and benefits, the patient was deemed in                            satisfactory condition to undergo the procedure.                           After obtaining informed consent, the colonoscope                            was passed under direct vision. Throughout the                            procedure, the patient's blood pressure, pulse, and                            oxygen saturations were monitored continuously. The                             [id number] was introduced through the anus and                            advanced to the cecum, identified by appendiceal                            orifice and ileocecal valve. The colonoscopy was                            performed without difficulty. The patient tolerated                            the procedure well. The quality of the bowel                            preparation was good. The ileocecal valve,                            appendiceal orifice, and rectum were photographed. Scope In: 9:01:06 AM Scope Out: 9:24:08 AM Scope Withdrawal Time: 0 hours 16 minutes 52 seconds  Total Procedure Duration: 0 hours 23 minutes 2 seconds  Findings:                 The digital rectal exam was normal.                           A 6 mm polyp was found in the proximal ascending                            colon. The polyp was sessile and had the typical                            appearance of a sessile serrated polyp (mucus cap                            and pit pattern). The polyp  was removed with a cold                            snare. Resection and retrieval were complete.                           Multiple medium-mouthed diverticula were found in                            the sigmoid colon.                           Internal hemorrhoids were found during retroflexion. Complications:            No immediate complications. Estimated Blood Loss:     Estimated blood loss was minimal. Impression:               - One 6 mm polyp in the proximal ascending colon,                            removed with a cold snare. Resected and retrieved.                           - Diverticulosis in the sigmoid colon.                           - Internal hemorrhoids. Recommendation:           - Patient has a contact number available for                            emergencies. The signs and symptoms of potential                            delayed complications were discussed with the                             patient. Return to normal activities tomorrow.                            Written discharge instructions were provided to the                            patient.                           - Resume previous diet.                           - Continue present medications.                           - Await pathology results.                           - Repeat colonoscopy in 5 years. Jerene Bears, MD 06/15/2020 9:28:44 AM This report has been signed electronically.

## 2020-06-15 NOTE — Progress Notes (Signed)
Lidocaine buffer

## 2020-06-19 ENCOUNTER — Telehealth: Payer: Self-pay

## 2020-06-19 NOTE — Telephone Encounter (Signed)
  Follow up Call-  Call back number 06/15/2020  Post procedure Call Back phone  # [phone]  Permission to leave phone message Yes  Some recent data might be hidden     Patient questions:  Do you have a fever, pain , or abdominal swelling? No. Pain Score  0 *  Have you tolerated food without any problems? Yes.    Have you been able to return to your normal activities? Yes.    Do you have any questions about your discharge instructions: Diet   No. Medications  No. Follow up visit  No.  Do you have questions or concerns about your Care? No.  Actions: * If pain score is 4 or above: No action needed, pain <4.  Have you developed a fever since your procedure? No 2.   Have you had an respiratory symptoms (SOB or cough) since your procedure? No  3.   Have you tested positive for COVID 19 since your procedure No  4.   Have you had any family members/close contacts diagnosed with the COVID 19 since your procedure?  No  If yes to any of these questions please route to Joylene John, RN and Erenest Rasher, RN

## 2020-06-24 ENCOUNTER — Encounter: Payer: Self-pay | Admitting: Internal Medicine

## 2021-02-11 ENCOUNTER — Other Ambulatory Visit: Payer: Self-pay | Admitting: Interventional Cardiology

## 2021-02-21 ENCOUNTER — Other Ambulatory Visit: Payer: Self-pay

## 2021-02-21 MED ORDER — METOPROLOL SUCCINATE ER 25 MG PO TB24: 25.0000 mg | ORAL_TABLET | Freq: Every day | ORAL | 0 refills | Status: DC

## 2021-04-30 ENCOUNTER — Ambulatory Visit: Payer: Self-pay | Admitting: Cardiology

## 2021-06-11 NOTE — Progress Notes (Signed)
Cardiology Office Note:    Date:  06/13/2021   ID:  Paul Sullivan, DOB 15-Jul-1971, MRN [id number]  PCP:  Chesley Noon, MD  [facility] Cardiologist:  Sinclair Grooms, MD  [place] Electrophysiologist:  None   Chief Complaint: yearly follow up   History of Present Illness:    Paul Sullivan is a 50 y.o. 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  History of inferior STEMI with DES to RCA placement.  Last cardiac catheterization 02/07/2019 which showed m RCA widely patent stent with diffuse luminal irregularities in the mid to distal RCA.  Eccentric 60 to 75% proximal LAD, unchanged from prior angiogram in 2017.  DFR with 0.92.  Ostial LCx with 20% stenosis with obtuse marginals and remainder of LCx noted to be widely patent.  EF was reported at 55%.  Aggressive CV risk factor modification and medical management was recommended at that time.  Last seen 01/2020.  Here today for follow up.  No complaints.  He walks 2 miles every morning without any limitation.  No chest pain, shortness of breath, orthopnea, PND, syncope, palpitations, dizziness, lower extremity edema or melena.  Past Medical History:  Diagnosis Date   Allergy    CAD (coronary artery disease)    a. Inf STEMI 3/17 - LHC pLAD 80, dLAD 50/60, oLCx 30, mRCA 100, 50, Normal LVF >> PCI - DES to RCA;  b. OP nuc stress study to assess LAD territory ischemia   Dyslipidemia, goal LDL below 70 02/07/2016   Myocardial infarction ([place]) 01/2016    Past Surgical History:  Procedure Laterality Date   CARDIAC CATHETERIZATION N/A 02/05/2016   Procedure: Left Heart Cath and Coronary Angiography;  Surgeon: Belva Crome, MD;  Location: [place] [facility];  Service: Cardiovascular;  Laterality: N/A;   INTRAVASCULAR PRESSURE WIRE/FFR STUDY N/A 02/07/2019   Procedure: INTRAVASCULAR PRESSURE WIRE/FFR STUDY;  Surgeon: Belva Crome, MD;   Location: [place] [facility];  Service: Cardiovascular;  Laterality: N/A;   LEFT HEART CATH AND CORONARY ANGIOGRAPHY N/A 02/07/2019   Procedure: LEFT HEART CATH AND CORONARY ANGIOGRAPHY;  Surgeon: Belva Crome, MD;  Location: [facility];  Service: Cardiovascular;  Laterality: N/A;   WISDOM TOOTH EXTRACTION      Current Medications: Current Meds  Medication Sig   acetaminophen (TYLENOL) 500 MG tablet Take 1,000 mg by mouth every 6 (six) hours as needed (for pain.).   aspirin EC 81 MG tablet Take 81 mg by mouth daily.   atorvastatin (LIPITOR) 80 MG tablet Take 1 tablet (80 mg total) by mouth daily at 6 PM. Please make overdue appt with Dr. Tamala Julian before anymore refills. Thank you 1st attempt   metoprolol succinate (TOPROL-XL) 25 MG 24 hr tablet Take 1 tablet (25 mg total) by mouth daily. Please keep upcoming appt in May 2022 before anymore refills. Thank you   nitroGLYCERIN (NITROSTAT) 0.4 MG SL tablet Place 1 tablet (0.4 mg total) under the tongue every 5 (five) minutes x 3 doses as needed for chest pain (Max 3rd dose call 911).     Allergies:   Patient has no known allergies.   Social History   Socioeconomic History   Marital status: Single    Spouse name: Not on file   Number of children: Not on file   Years of education: Not on file   Highest education  level: Not on file  Occupational History   Occupation: [employer]  Tobacco Use   Smoking status: Never   Smokeless tobacco: Never  Vaping Use   Vaping Use: Never used  Substance and Sexual Activity   Alcohol use: Yes    Alcohol/week: 2.0 standard drinks    Types: 2 Cans of beer per week   Drug use: No   Sexual activity: Not on file  Other Topics Concern   Not on file  Social History Narrative   Lives in [place] with family   Social Determinants of Health   Financial Resource Strain: Not on file  Food Insecurity: Not on file  Transportation Needs: Not on file  Physical Activity: Not  on file  Stress: Not on file  Social Connections: Not on file     Family History: The patient's family history includes Colon cancer (age of onset: 68) in his father; Colon polyps in his mother; Coronary artery disease in his mother; Heart failure in his maternal uncle; Hyperlipidemia in his father and mother; Hypertension in his father and mother; Leukemia in his father. There is no history of Esophageal cancer, Rectal cancer, or Stomach cancer.    ROS:   Please see the history of present illness.    All other systems reviewed and are negative.   EKGs/Labs/Other Studies Reviewed:    The following studies were reviewed today:  INTRAVASCULAR PRESSURE WIRE/FFR STUDY  02/07/2019  LEFT HEART CATH AND CORONARY ANGIOGRAPHY    Conclusion  Dominant right coronary artery. Mid RCA with widely patent stent.  Diffuse luminal irregularities mid to distal RCA. Left main is widely patent. Eccentric 60 to 75% proximal LAD, unchanged from angiogram 2017.  DFR 0.92 performed today.  LAD in the apical segment contains 50% tandem stenoses. Ostial 20% circumflex.  Obtuse marginals and remainder of circumflex is widely patent. Normal left ventricular size.  LVEDP 23 mmHg.  EF 55%.   RECOMMENDATIONS:   Continue aggressive secondary risk factor modification.  Diagnostic Dominance: Co-dominant       EKG:  EKG is ordered today.  The ekg ordered today demonstrates normal sinus rhythm  Recent Labs: No results found for requested labs within last 8760 hours.  Recent Lipid Panel    Component Value Date/Time   CHOL 113 09/27/2018 1647   TRIG 102 09/27/2018 1647   HDL 35 (L) 09/27/2018 1647   CHOLHDL 3.2 09/27/2018 1647   CHOLHDL 2.9 07/31/2017 0852   VLDL 12 07/31/2017 0852   LDLCALC 58 09/27/2018 1647    Physical Exam:    VS:  BP 116/82   Pulse 77   Ht 6\' 2"  (1.88 m)   Wt 206 lb (93.4 kg)   BMI 26.45 kg/m     Wt Readings from Last 3 Encounters:  06/13/21 206 lb (93.4 kg)  06/15/20  204 lb (92.5 kg)  06/01/20 204 lb (92.5 kg)     GEN: Well nourished, well developed in no acute distress HEENT: Normal NECK: No JVD; No carotid bruits LYMPHATICS: No lymphadenopathy CARDIAC: RRR, no murmurs, rubs, gallops RESPIRATORY:  Clear to auscultation without rales, wheezing or rhonchi  ABDOMEN: Soft, non-tender, non-distended MUSCULOSKELETAL:  No edema; No deformity  SKIN: Warm and dry NEUROLOGIC:  Alert and oriented x 3 PSYCHIATRIC:  Normal affect   ASSESSMENT AND PLAN:    CAD No angina.  Continue exercise regimen.  Continue aspirin, statin and beta-blocker.  2.  Hyperlipidemia Due for labs.  Will update.  Continue statin.  Medication Adjustments/Labs and Tests Ordered: Current medicines are reviewed at length with the patient today.  Concerns regarding medicines are outlined above.  No orders of the defined types were placed in this encounter.  No orders of the defined types were placed in this encounter.   There are no Patient Instructions on file for this visit.   Jarrett Soho, [place]  06/13/2021 9:20 AM    [place]

## 2021-06-13 ENCOUNTER — Other Ambulatory Visit: Payer: Self-pay

## 2021-06-13 ENCOUNTER — Ambulatory Visit (INDEPENDENT_AMBULATORY_CARE_PROVIDER_SITE_OTHER): Payer: Self-pay | Admitting: Physician Assistant

## 2021-06-13 ENCOUNTER — Encounter: Payer: Self-pay | Admitting: Physician Assistant

## 2021-06-13 VITALS — BP 116/82 | HR 77 | Ht 74.0 in | Wt 206.0 lb

## 2021-06-13 DIAGNOSIS — I25119 Atherosclerotic heart disease of native coronary artery with unspecified angina pectoris: Secondary | ICD-10-CM

## 2021-06-13 DIAGNOSIS — E785 Hyperlipidemia, unspecified: Secondary | ICD-10-CM

## 2021-06-13 LAB — HEPATIC FUNCTION PANEL
ALT: 35 IU/L (ref 0–44)
AST: 24 IU/L (ref 0–40)
Albumin: 4.7 g/dL (ref 4.0–5.0)
Alkaline Phosphatase: 63 IU/L (ref 44–121)
Bilirubin Total: 0.4 mg/dL (ref 0.0–1.2)
Bilirubin, Direct: 0.11 mg/dL (ref 0.00–0.40)
Total Protein: 7.2 g/dL (ref 6.0–8.5)

## 2021-06-13 LAB — LIPID PANEL
Chol/HDL Ratio: 5.5 ratio — ABNORMAL HIGH (ref 0.0–5.0)
Cholesterol, Total: 227 mg/dL — ABNORMAL HIGH (ref 100–199)
HDL: 41 mg/dL (ref >39)
LDL Chol Calc (NIH): 169 mg/dL — ABNORMAL HIGH (ref 0–99)
Triglycerides: 93 mg/dL (ref 0–149)
VLDL Cholesterol Cal: 17 mg/dL (ref 5–40)

## 2021-06-13 MED ORDER — METOPROLOL SUCCINATE ER 25 MG PO TB24: 25.0000 mg | ORAL_TABLET | Freq: Every day | ORAL | 3 refills | Status: DC

## 2021-06-13 MED ORDER — ATORVASTATIN CALCIUM 80 MG PO TABS: 80.0000 mg | ORAL_TABLET | Freq: Every day | ORAL | 3 refills | Status: DC

## 2021-06-13 NOTE — Patient Instructions (Signed)
Medication Instructions:  Your physician recommends that you continue on your current medications as directed. Please refer to the Current Medication list given to you today.  *If you need a refill on your cardiac medications before your next appointment, please call your pharmacy*   Lab Work: TODAY:  LIPID & LFT   If you have labs (blood work) drawn today and your tests are completely normal, you will receive your results only by: [place] (if you have MyChart) OR A paper copy in the mail If you have any lab test that is abnormal or we need to change your treatment, we will call you to review the results.   Testing/Procedures: None ordered   Follow-Up: At [facility], you and your health needs are our priority.  As part of our continuing mission to provide you with exceptional heart care, we have created designated Provider Care Teams.  These Care Teams include your primary Cardiologist (physician) and Advanced Practice Providers (APPs -  Physician Assistants and Nurse Practitioners) who all work together to provide you with the care you need, when you need it.  We recommend signing up for the patient portal called "MyChart".  Sign up information is provided on this After Visit Summary.  MyChart is used to connect with patients for Virtual Visits (Telemedicine).  Patients are able to view lab/test results, encounter notes, upcoming appointments, etc.  Non-urgent messages can be sent to your provider as well.   To learn more about what you can do with MyChart, go to [email or web address].    Your next appointment:   12 month(s)  The format for your next appointment:   In Person  Provider:   You may see Sinclair Grooms, MD or one of the following Advanced Practice Providers on your designated Care Team:   Cecilie Kicks, NP    Other Instructions

## 2021-06-14 ENCOUNTER — Other Ambulatory Visit: Payer: Self-pay | Admitting: *Deleted

## 2021-06-14 ENCOUNTER — Telehealth: Payer: Self-pay | Admitting: *Deleted

## 2021-06-14 DIAGNOSIS — Z79899 Other long term (current) drug therapy: Secondary | ICD-10-CM

## 2021-06-14 NOTE — Telephone Encounter (Signed)
-----   Message from [place], [place] sent at 06/14/2021 11:59 AM EDT ----- LDL 169.  Goal is less than 70.  Is patient is taking his Lipitor 80 mg daily?

## 2021-09-13 ENCOUNTER — Other Ambulatory Visit: Payer: Self-pay

## 2022-06-08 ENCOUNTER — Other Ambulatory Visit: Payer: Self-pay | Admitting: Cardiology

## 2022-06-10 ENCOUNTER — Other Ambulatory Visit: Payer: Self-pay | Admitting: *Deleted

## 2022-07-18 ENCOUNTER — Other Ambulatory Visit: Payer: Self-pay | Admitting: Interventional Cardiology

## 2022-08-21 ENCOUNTER — Telehealth: Payer: Self-pay | Admitting: Interventional Cardiology

## 2022-08-21 MED ORDER — ATORVASTATIN CALCIUM 80 MG PO TABS: ORAL_TABLET | ORAL | 0 refills | Status: DC

## 2022-08-21 MED ORDER — METOPROLOL SUCCINATE ER 25 MG PO TB24: ORAL_TABLET | ORAL | 0 refills | Status: DC

## 2022-08-21 NOTE — Telephone Encounter (Signed)
Pt's medications was sent to pt's pharmacy as requested. Confirmation received.  

## 2022-08-21 NOTE — Telephone Encounter (Signed)
*  STAT* If patient is at the pharmacy, call can be transferred to refill team.   1. Which medications need to be refilled? (please list name of each medication and dose if known)   atorvastatin (LIPITOR) 80 MG tablet    metoprolol succinate (TOPROL-XL) 25 MG 24 hr tablet    2. Which pharmacy/location (including street and city if local pharmacy) is medication to be sent to? [id number] - [place], [address]  3. Do they need a 30 day or 90 day supply?  90 day  Pt has scheduled appt on 09/09/22. Please advise

## 2022-09-02 NOTE — Progress Notes (Signed)
Cardiology Office Note:    Date:  09/09/2022   ID:  [name], DOB 1971-11-16, MRN [id number]  PCP:  Chesley Noon, MD  [place] [facility] Cardiologist:  Sinclair Grooms, MD     Referring MD: Chesley Noon, MD   Chief Complaint:  No chief complaint on file.     History of Present Illness:   Paul Sullivan is a 51 y.o. 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    Last cardiac catheterization 02/07/2019 which showed m RCA widely patent stent with diffuse luminal irregularities in the mid to distal RCA.  Eccentric 60 to 75% proximal LAD, unchanged from prior angiogram in 2017.  DFR with 0.92.  Ostial LCx with 20% stenosis with obtuse marginals and remainder of LCx noted to be widely patent.  EF was reported at 55%.  Aggressive CV risk factor modification and medical management was recommended at that time.     Patient last saw Dr. Tamala Julian and was doing well.   Patient comes in for yearly f/u. Denies chest pain, dyspnea, palpitations, edema. Walks 2 miles daily. Occasional dizziness when he first gets up in the am but no other times. His maternal GF died of an MI 24 and all his siblings died young of heart disease. Mother had TAVR. Strong family history of diabetes.     Past Medical History:  Diagnosis Date   Allergy    CAD (coronary artery disease)    a. Inf STEMI 3/17 - LHC pLAD 80, dLAD 50/60, oLCx 30, mRCA 100, 50, Normal LVF >> PCI - DES to RCA;  b. OP nuc stress study to assess LAD territory ischemia   Dyslipidemia, goal LDL below 70 02/07/2016   Myocardial infarction ([place]) 01/2016   Current Medications: Current Meds  Medication Sig   acetaminophen (TYLENOL) 500 MG tablet Take 1,000 mg by mouth every 6 (six) hours as needed (for pain.).   aspirin EC 81 MG tablet Take 81 mg by mouth daily.   atorvastatin (LIPITOR) 80 MG tablet TAKE 1 TABLET(80 MG) BY MOUTH DAILY AT 6 PM    metoprolol succinate (TOPROL-XL) 25 MG 24 hr tablet TAKE 1 TABLET(25 MG) BY MOUTH DAILY   nitroGLYCERIN (NITROSTAT) 0.4 MG SL tablet Place 1 tablet (0.4 mg total) under the tongue every 5 (five) minutes x 3 doses as needed for chest pain (Max 3rd dose call 911).    Allergies:   Patient has no known allergies.   Social History   Tobacco Use   Smoking status: Never   Smokeless tobacco: Never  Vaping Use   Vaping Use: Never used  Substance Use Topics   Alcohol use: Yes    Alcohol/week: 2.0 standard drinks of alcohol    Types: 2 Cans of beer per week   Drug use: No    Family Hx: The patient's family history includes Colon cancer (age of onset: 61) in his father; Colon polyps in his mother; Coronary artery disease in his mother; Heart failure in his maternal uncle; Hyperlipidemia in his father and mother; Hypertension in his father and mother; Leukemia in his father. There is no history of Esophageal cancer, Rectal cancer, or Stomach cancer.  ROS   EKGs/Labs/Other Test Reviewed:    EKG:  EKG is   ordered today.  The ekg ordered today demonstrates NSR normal EKG.   Recent Labs: No results found for  requested labs within last 365 days.   Recent Lipid Panel No results for input(s): "CHOL", "TRIG", "HDL", "VLDL", "LDLCALC", "LDLDIRECT" in the last 8760 hours.   Prior CV Studies:     INTRAVASCULAR PRESSURE WIRE/FFR STUDY  02/07/2019  LEFT HEART CATH AND CORONARY ANGIOGRAPHY      Conclusion   Dominant right coronary artery. Mid RCA with widely patent stent.  Diffuse luminal irregularities mid to distal RCA. Left main is widely patent. Eccentric 60 to 75% proximal LAD, unchanged from angiogram 2017.  DFR 0.92 performed today.  LAD in the apical segment contains 50% tandem stenoses. Ostial 20% circumflex.  Obtuse marginals and remainder of circumflex is widely patent. Normal left ventricular size.  LVEDP 23 mmHg.  EF 55%.   RECOMMENDATIONS:   Continue aggressive secondary risk  factor modification.   Diagnostic Dominance: Co-dominant            Risk Assessment/Calculations/Metrics:              Physical Exam:    VS:  BP 110/70   Pulse 68   Ht '6\' 2"'$  (1.88 m)   Wt 211 lb (95.7 kg)   SpO2 95%   BMI 27.09 kg/m     Wt Readings from Last 3 Encounters:  09/09/22 211 lb (95.7 kg)  06/13/21 206 lb (93.4 kg)  06/15/20 204 lb (92.5 kg)    Physical Exam  GEN: Well nourished, well developed, in no acute distress  Neck: no JVD, carotid bruits, or masses Cardiac:RRR; S4 no murmurs, rubs Respiratory:  clear to auscultation bilaterally, normal work of breathing GI: soft, nontender, nondistended, + BS Ext: without cyanosis, clubbing, or edema, Good distal pulses bilaterally Neuro:  Alert and Oriented x 3,  Psych: euthymic mood, full affect       ASSESSMENT & PLAN:   No problem-specific Assessment & Plan notes found for this encounter.   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 Aggressive medical therapy.  Hyperlipidemia LDL 169 05/2021 on lipitor. Recheck today. May need referal to lipid clinic  Diabetes screening  Family history of early CAD          Dispo:  No follow-ups on file.   Medication Adjustments/Labs and Tests Ordered: Current medicines are reviewed at length with the patient today.  Concerns regarding medicines are outlined above.  Tests Ordered: Orders Placed This Encounter  Procedures   Lipid panel   Comprehensive metabolic panel   CBC   Lipoprotein A (LPA)   Hemoglobin A1c   TSH   EKG 12-Lead   Medication Changes: No orders of the defined types were placed in this encounter.  Signed, Ermalinda Barrios, PA-C  09/09/2022 8:11 AM    [facility] [address], [place], [place]  [address] Phone: [phone]; Fax: [phone]

## 2022-09-09 ENCOUNTER — Ambulatory Visit: Payer: BC Managed Care – PPO | Attending: Physician Assistant | Admitting: Physician Assistant

## 2022-09-09 ENCOUNTER — Encounter: Payer: Self-pay | Admitting: Physician Assistant

## 2022-09-09 DIAGNOSIS — E785 Hyperlipidemia, unspecified: Secondary | ICD-10-CM | POA: Diagnosis not present

## 2022-09-09 DIAGNOSIS — Z8249 Family history of ischemic heart disease and other diseases of the circulatory system: Secondary | ICD-10-CM | POA: Diagnosis not present

## 2022-09-09 DIAGNOSIS — Z131 Encounter for screening for diabetes mellitus: Secondary | ICD-10-CM

## 2022-09-09 DIAGNOSIS — I2510 Atherosclerotic heart disease of native coronary artery without angina pectoris: Secondary | ICD-10-CM

## 2022-09-09 NOTE — Patient Instructions (Addendum)
Medication Instructions:  Your physician recommends that you continue on your current medications as directed. Please refer to the Current Medication list given to you today.   *If you need a refill on your cardiac medications before your next appointment, please call your pharmacy*   Lab Work: Lipids, Cmp, Cbc, LPA, HgB A1c, Tsh  - today   If you have labs (blood work) drawn today and your tests are completely normal, you will receive your results only by: [place] (if you have MyChart) OR A paper copy in the mail If you have any lab test that is abnormal or we need to change your treatment, we will call you to review the results.   Testing/Procedures: None ordered    Follow-Up: At [facility], you and your health needs are our priority.  As part of our continuing mission to provide you with exceptional heart care, we have created designated Provider Care Teams.  These Care Teams include your primary Cardiologist (physician) and Advanced Practice Providers (APPs -  Physician Assistants and Nurse Practitioners) who all work together to provide you with the care you need, when you need it.  We recommend signing up for the patient portal called "MyChart".  Sign up information is provided on this After Visit Summary.  MyChart is used to connect with patients for Virtual Visits (Telemedicine).  Patients are able to view lab/test results, encounter notes, upcoming appointments, etc.  Non-urgent messages can be sent to your provider as well.   To learn more about what you can do with MyChart, go to [email or web address].    Your next appointment:   12 month(s)  The format for your next appointment:   In Person  Provider:   Carlyle Dolly, MD    Other Instructions   Important Information About Sugar

## 2022-09-10 LAB — CBC
Hematocrit: 46.9 % (ref 37.5–51.0)
Hemoglobin: 16.1 g/dL (ref 13.0–17.7)
MCH: 31.3 pg (ref 26.6–33.0)
MCHC: 34.3 g/dL (ref 31.5–35.7)
MCV: 91 fL (ref 79–97)
Platelets: 209 10*3/uL (ref 150–450)
RBC: 5.14 x10E6/uL (ref 4.14–5.80)
RDW: 11.9 % (ref 11.6–15.4)
WBC: 6.1 10*3/uL (ref 3.4–10.8)

## 2022-09-10 LAB — COMPREHENSIVE METABOLIC PANEL
ALT: 124 IU/L — ABNORMAL HIGH (ref 0–44)
AST: 74 IU/L — ABNORMAL HIGH (ref 0–40)
Albumin/Globulin Ratio: 2 (ref 1.2–2.2)
Albumin: 4.8 g/dL (ref 4.1–5.1)
Alkaline Phosphatase: 58 IU/L (ref 44–121)
BUN/Creatinine Ratio: 12 (ref 9–20)
BUN: 10 mg/dL (ref 6–24)
Bilirubin Total: 0.5 mg/dL (ref 0.0–1.2)
CO2: 25 mmol/L (ref 20–29)
Calcium: 9.5 mg/dL (ref 8.7–10.2)
Chloride: 98 mmol/L (ref 96–106)
Creatinine, Ser: 0.84 mg/dL (ref 0.76–1.27)
Globulin, Total: 2.4 g/dL (ref 1.5–4.5)
Glucose: 108 mg/dL — ABNORMAL HIGH (ref 70–99)
Potassium: 4.6 mmol/L (ref 3.5–5.2)
Sodium: 135 mmol/L (ref 134–144)
Total Protein: 7.2 g/dL (ref 6.0–8.5)
eGFR: 106 mL/min/{1.73_m2} (ref >59)

## 2022-09-10 LAB — LIPID PANEL
Chol/HDL Ratio: 3.3 ratio (ref 0.0–5.0)
Cholesterol, Total: 119 mg/dL (ref 100–199)
HDL: 36 mg/dL — ABNORMAL LOW (ref >39)
LDL Chol Calc (NIH): 68 mg/dL (ref 0–99)
Triglycerides: 73 mg/dL (ref 0–149)
VLDL Cholesterol Cal: 15 mg/dL (ref 5–40)

## 2022-09-10 LAB — TSH: TSH: 2.43 u[IU]/mL (ref 0.450–4.500)

## 2022-09-10 LAB — LIPOPROTEIN A (LPA): Lipoprotein (a): 30.5 nmol/L (ref <75.0)

## 2022-09-10 LAB — HEMOGLOBIN A1C
Est. average glucose Bld gHb Est-mCnc: 134 mg/dL
Hgb A1c MFr Bld: 6.3 % — ABNORMAL HIGH (ref 4.8–5.6)

## 2022-09-11 ENCOUNTER — Other Ambulatory Visit: Payer: Self-pay

## 2022-09-11 DIAGNOSIS — Z79899 Other long term (current) drug therapy: Secondary | ICD-10-CM

## 2022-09-12 ENCOUNTER — Other Ambulatory Visit: Payer: Self-pay

## 2022-09-12 DIAGNOSIS — Z79899 Other long term (current) drug therapy: Secondary | ICD-10-CM

## 2022-09-23 ENCOUNTER — Other Ambulatory Visit: Payer: Self-pay | Admitting: Interventional Cardiology

## 2022-10-10 ENCOUNTER — Other Ambulatory Visit (HOSPITAL_COMMUNITY)
Admission: RE | Admit: 2022-10-10 | Discharge: 2022-10-10 | Disposition: A | Discharge status: Home / Self Care | Payer: BC Managed Care – PPO | Source: Ambulatory Visit | Attending: Physician Assistant | Admitting: Physician Assistant

## 2022-10-10 DIAGNOSIS — Z79899 Other long term (current) drug therapy: Secondary | ICD-10-CM | POA: Insufficient documentation

## 2022-10-10 LAB — HEPATIC FUNCTION PANEL
ALT: 114 U/L — ABNORMAL HIGH (ref 0–44)
AST: 78 U/L — ABNORMAL HIGH (ref 15–41)
Albumin: 4.1 g/dL (ref 3.5–5.0)
Alkaline Phosphatase: 55 U/L (ref 38–126)
Bilirubin, Direct: 0.1 mg/dL (ref 0.0–0.2)
Indirect Bilirubin: 0.6 mg/dL (ref 0.3–0.9)
Total Bilirubin: 0.7 mg/dL (ref 0.3–1.2)
Total Protein: 7.1 g/dL (ref 6.5–8.1)

## 2022-10-17 ENCOUNTER — Telehealth: Payer: Self-pay | Admitting: Physician Assistant

## 2022-10-17 DIAGNOSIS — Z79899 Other long term (current) drug therapy: Secondary | ICD-10-CM

## 2022-10-17 NOTE — Telephone Encounter (Signed)
-----   Message from Rollen Sox, [facility] sent at 10/17/2022  5:05 PM EST ----- Recommend holding atorvastatin and rechecking LFTs in 2-4 weeks  ----- Message ----- From: Mendel Ryder, CMA Sent: 10/17/2022   4:47 PM EST To: Rollen Sox, [place]  Pt states that he drinks alcohol on the weekends but not every weekend. He states that he used to take acetaminophen 2 to 3 times a day but does not take it as often anymore. Pt states that he has been using Ibuprofen for the last 3 weeks.

## 2022-10-17 NOTE — Telephone Encounter (Signed)
Imogene Burn, PA-C 10/13/2022  9:12 AM EST     LFT's still high on Lipitor. Copying pharmacy to see if they have any recommendations.    Called patient with results as written above. Will send copy to patient's PCP so we can get recommendations.

## 2022-10-17 NOTE — Telephone Encounter (Signed)
Pt agreeable to plan and wants to have labs drawn at [facility].

## 2022-10-17 NOTE — Telephone Encounter (Signed)
Patient returned call for lab results.
